# Patient Record
Sex: Female | Born: 1970 | Race: White | Hispanic: No | Smoking: Never smoker
Health system: Southern US, Community
[De-identification: ages and names within clinical notes are randomized; demographics above are authoritative.]

## PROBLEM LIST (undated history)

## (undated) DIAGNOSIS — E785 Hyperlipidemia, unspecified: Secondary | ICD-10-CM

## (undated) DIAGNOSIS — E119 Type 2 diabetes mellitus without complications: Secondary | ICD-10-CM

## (undated) DIAGNOSIS — K859 Acute pancreatitis without necrosis or infection, unspecified: Secondary | ICD-10-CM

## (undated) DIAGNOSIS — I10 Essential (primary) hypertension: Secondary | ICD-10-CM

## (undated) HISTORY — PX: HERNIA REPAIR: SHX51

## (undated) HISTORY — DX: Hyperlipidemia, unspecified: E78.5

---

## 2001-05-21 ENCOUNTER — Emergency Department (HOSPITAL_COMMUNITY): Admission: EM | Admit: 2001-05-21 | Discharge: 2001-05-21 | Payer: Self-pay | Admitting: Emergency Medicine

## 2008-08-24 ENCOUNTER — Ambulatory Visit (HOSPITAL_COMMUNITY): Admission: RE | Admit: 2008-08-24 | Discharge: 2008-08-24 | Payer: Self-pay | Admitting: Unknown Physician Specialty

## 2008-09-21 ENCOUNTER — Ambulatory Visit (HOSPITAL_COMMUNITY): Admission: RE | Admit: 2008-09-21 | Discharge: 2008-09-21 | Payer: Self-pay | Admitting: Unknown Physician Specialty

## 2008-10-18 ENCOUNTER — Ambulatory Visit (HOSPITAL_COMMUNITY): Admission: RE | Admit: 2008-10-18 | Discharge: 2008-10-18 | Payer: Self-pay | Admitting: Unknown Physician Specialty

## 2008-11-15 ENCOUNTER — Ambulatory Visit (HOSPITAL_COMMUNITY): Admission: RE | Admit: 2008-11-15 | Discharge: 2008-11-15 | Payer: Self-pay | Admitting: Unknown Physician Specialty

## 2008-12-07 ENCOUNTER — Ambulatory Visit (HOSPITAL_COMMUNITY): Admission: RE | Admit: 2008-12-07 | Discharge: 2008-12-07 | Payer: Self-pay | Admitting: Unknown Physician Specialty

## 2008-12-27 ENCOUNTER — Ambulatory Visit (HOSPITAL_COMMUNITY): Admission: RE | Admit: 2008-12-27 | Discharge: 2008-12-27 | Payer: Self-pay | Admitting: Unknown Physician Specialty

## 2009-01-19 ENCOUNTER — Ambulatory Visit (HOSPITAL_COMMUNITY): Admission: RE | Admit: 2009-01-19 | Discharge: 2009-01-19 | Payer: Self-pay | Admitting: Unknown Physician Specialty

## 2009-07-12 ENCOUNTER — Emergency Department (HOSPITAL_COMMUNITY): Admission: EM | Admit: 2009-07-12 | Discharge: 2009-07-12 | Payer: Self-pay | Admitting: Emergency Medicine

## 2009-07-19 ENCOUNTER — Emergency Department (HOSPITAL_COMMUNITY): Admission: EM | Admit: 2009-07-19 | Discharge: 2009-07-19 | Payer: Self-pay | Admitting: Emergency Medicine

## 2009-12-04 ENCOUNTER — Emergency Department (HOSPITAL_COMMUNITY): Admission: EM | Admit: 2009-12-04 | Discharge: 2009-12-04 | Payer: Self-pay | Admitting: Emergency Medicine

## 2009-12-29 ENCOUNTER — Ambulatory Visit (HOSPITAL_COMMUNITY): Admission: RE | Admit: 2009-12-29 | Discharge: 2009-12-29 | Payer: Self-pay | Admitting: General Surgery

## 2010-01-19 ENCOUNTER — Emergency Department (HOSPITAL_COMMUNITY): Admission: EM | Admit: 2010-01-19 | Discharge: 2010-01-20 | Payer: Self-pay | Admitting: Emergency Medicine

## 2010-03-14 ENCOUNTER — Emergency Department (HOSPITAL_COMMUNITY): Admission: EM | Admit: 2010-03-14 | Discharge: 2010-03-14 | Payer: Self-pay | Admitting: Emergency Medicine

## 2010-03-23 ENCOUNTER — Ambulatory Visit (HOSPITAL_COMMUNITY): Admission: RE | Admit: 2010-03-23 | Discharge: 2010-03-23 | Payer: Self-pay | Admitting: General Surgery

## 2010-09-27 LAB — GLUCOSE, CAPILLARY
Glucose-Capillary: 150 mg/dL — ABNORMAL HIGH (ref 70–99)
Glucose-Capillary: 158 mg/dL — ABNORMAL HIGH (ref 70–99)

## 2010-09-27 LAB — SURGICAL PCR SCREEN
MRSA, PCR: NEGATIVE
Staphylococcus aureus: NEGATIVE

## 2010-09-27 LAB — CBC
HCT: 38.7 % (ref 36.0–46.0)
Hemoglobin: 13.3 g/dL (ref 12.0–15.0)
MCH: 29.7 pg (ref 26.0–34.0)
MCHC: 34.5 g/dL (ref 30.0–36.0)
MCV: 86.1 fL (ref 78.0–100.0)
Platelets: 328 10*3/uL (ref 150–400)
RBC: 4.49 MIL/uL (ref 3.87–5.11)
RDW: 13.6 % (ref 11.5–15.5)
WBC: 10.2 10*3/uL (ref 4.0–10.5)

## 2010-09-27 LAB — HCG, QUANTITATIVE, PREGNANCY: hCG, Beta Chain, Quant, S: <2 m[IU]/mL (ref <5)

## 2010-09-27 LAB — BASIC METABOLIC PANEL
BUN: 9 mg/dL (ref 6–23)
CO2: 23 mEq/L (ref 19–32)
Calcium: 9.8 mg/dL (ref 8.4–10.5)
Chloride: 104 mEq/L (ref 96–112)
Creatinine, Ser: 0.58 mg/dL (ref 0.4–1.2)
GFR calc Af Amer: >60 mL/min (ref >60)
GFR calc non Af Amer: >60 mL/min (ref >60)
Glucose, Bld: 147 mg/dL — ABNORMAL HIGH (ref 70–99)
Potassium: 4.4 mEq/L (ref 3.5–5.1)
Sodium: 138 mEq/L (ref 135–145)

## 2010-09-28 LAB — CBC
HCT: 41.4 % (ref 36.0–46.0)
Hemoglobin: 14 g/dL (ref 12.0–15.0)
MCH: 29.3 pg (ref 26.0–34.0)
MCHC: 33.9 g/dL (ref 30.0–36.0)
MCV: 86.6 fL (ref 78.0–100.0)
Platelets: 371 10*3/uL (ref 150–400)
RBC: 4.78 MIL/uL (ref 3.87–5.11)
RDW: 14.1 % (ref 11.5–15.5)
WBC: 11.6 10*3/uL — ABNORMAL HIGH (ref 4.0–10.5)

## 2010-09-28 LAB — DIFFERENTIAL
Basophils Absolute: 0.1 10*3/uL (ref 0.0–0.1)
Basophils Relative: 1 % (ref 0–1)
Eosinophils Absolute: 0.4 10*3/uL (ref 0.0–0.7)
Eosinophils Relative: 3 % (ref 0–5)
Lymphocytes Relative: 31 % (ref 12–46)
Lymphs Abs: 3.6 10*3/uL (ref 0.7–4.0)
Monocytes Absolute: 0.8 10*3/uL (ref 0.1–1.0)
Monocytes Relative: 7 % (ref 3–12)
Neutro Abs: 6.7 10*3/uL (ref 1.7–7.7)
Neutrophils Relative %: 58 % (ref 43–77)

## 2010-09-28 LAB — COMPREHENSIVE METABOLIC PANEL
ALT: 16 U/L (ref 0–35)
AST: 18 U/L (ref 0–37)
Albumin: 3.7 g/dL (ref 3.5–5.2)
Alkaline Phosphatase: 68 U/L (ref 39–117)
BUN: 10 mg/dL (ref 6–23)
CO2: 20 mEq/L (ref 19–32)
Calcium: 9.6 mg/dL (ref 8.4–10.5)
Chloride: 106 mEq/L (ref 96–112)
Creatinine, Ser: 0.63 mg/dL (ref 0.4–1.2)
GFR calc Af Amer: >60 mL/min (ref >60)
GFR calc non Af Amer: >60 mL/min (ref >60)
Glucose, Bld: 114 mg/dL — ABNORMAL HIGH (ref 70–99)
Potassium: 4 mEq/L (ref 3.5–5.1)
Sodium: 137 mEq/L (ref 135–145)
Total Bilirubin: 0.4 mg/dL (ref 0.3–1.2)
Total Protein: 7 g/dL (ref 6.0–8.3)

## 2010-09-28 LAB — PREGNANCY, URINE: Preg Test, Ur: NEGATIVE

## 2010-09-28 LAB — URINALYSIS, ROUTINE W REFLEX MICROSCOPIC
Bilirubin Urine: NEGATIVE
Glucose, UA: NEGATIVE mg/dL
Hgb urine dipstick: NEGATIVE
Ketones, ur: NEGATIVE mg/dL
Nitrite: NEGATIVE
Protein, ur: NEGATIVE mg/dL
Specific Gravity, Urine: >1.03 — ABNORMAL HIGH (ref 1.005–1.030)
Urobilinogen, UA: 0.2 mg/dL (ref 0.0–1.0)
pH: 5.5 (ref 5.0–8.0)

## 2010-09-28 LAB — LIPASE, BLOOD: Lipase: 32 U/L (ref 11–59)

## 2010-09-30 LAB — GLUCOSE, CAPILLARY
Glucose-Capillary: 127 mg/dL — ABNORMAL HIGH (ref 70–99)
Glucose-Capillary: 191 mg/dL — ABNORMAL HIGH (ref 70–99)

## 2010-10-01 LAB — HCG, QUANTITATIVE, PREGNANCY: hCG, Beta Chain, Quant, S: <2 m[IU]/mL (ref <5)

## 2010-10-01 LAB — CBC
HCT: 38.9 % (ref 36.0–46.0)
Hemoglobin: 13.5 g/dL (ref 12.0–15.0)
MCHC: 34.7 g/dL (ref 30.0–36.0)
MCV: 86 fL (ref 78.0–100.0)
Platelets: 287 10*3/uL (ref 150–400)
RBC: 4.52 MIL/uL (ref 3.87–5.11)
RDW: 13.2 % (ref 11.5–15.5)
WBC: 10.1 10*3/uL (ref 4.0–10.5)

## 2010-10-01 LAB — BASIC METABOLIC PANEL
BUN: 5 mg/dL — ABNORMAL LOW (ref 6–23)
CO2: 20 mEq/L (ref 19–32)
Calcium: 9.8 mg/dL (ref 8.4–10.5)
Chloride: 105 mEq/L (ref 96–112)
Creatinine, Ser: 0.57 mg/dL (ref 0.4–1.2)
GFR calc Af Amer: >60 mL/min (ref >60)
GFR calc non Af Amer: >60 mL/min (ref >60)
Glucose, Bld: 186 mg/dL — ABNORMAL HIGH (ref 70–99)
Potassium: 3.9 mEq/L (ref 3.5–5.1)
Sodium: 133 mEq/L — ABNORMAL LOW (ref 135–145)

## 2010-10-01 LAB — SURGICAL PCR SCREEN
MRSA, PCR: NEGATIVE
Staphylococcus aureus: NEGATIVE

## 2015-09-25 ENCOUNTER — Emergency Department (HOSPITAL_COMMUNITY): Payer: Worker's Compensation

## 2015-09-25 ENCOUNTER — Emergency Department (HOSPITAL_COMMUNITY)
Admission: EM | Admit: 2015-09-25 | Discharge: 2015-09-25 | Disposition: A | Discharge status: Home / Self Care | Payer: Worker's Compensation | Attending: Emergency Medicine | Admitting: Emergency Medicine

## 2015-09-25 ENCOUNTER — Encounter (HOSPITAL_COMMUNITY): Payer: Self-pay | Admitting: *Deleted

## 2015-09-25 DIAGNOSIS — Y9289 Other specified places as the place of occurrence of the external cause: Secondary | ICD-10-CM | POA: Insufficient documentation

## 2015-09-25 DIAGNOSIS — Y990 Civilian activity done for income or pay: Secondary | ICD-10-CM | POA: Insufficient documentation

## 2015-09-25 DIAGNOSIS — I10 Essential (primary) hypertension: Secondary | ICD-10-CM | POA: Insufficient documentation

## 2015-09-25 DIAGNOSIS — S0093XA Contusion of unspecified part of head, initial encounter: Secondary | ICD-10-CM

## 2015-09-25 DIAGNOSIS — Y9389 Activity, other specified: Secondary | ICD-10-CM | POA: Insufficient documentation

## 2015-09-25 DIAGNOSIS — W208XXA Other cause of strike by thrown, projected or falling object, initial encounter: Secondary | ICD-10-CM | POA: Diagnosis not present

## 2015-09-25 DIAGNOSIS — S82242A Displaced spiral fracture of shaft of left tibia, initial encounter for closed fracture: Secondary | ICD-10-CM | POA: Insufficient documentation

## 2015-09-25 DIAGNOSIS — F419 Anxiety disorder, unspecified: Secondary | ICD-10-CM | POA: Diagnosis not present

## 2015-09-25 DIAGNOSIS — S82202A Unspecified fracture of shaft of left tibia, initial encounter for closed fracture: Secondary | ICD-10-CM

## 2015-09-25 DIAGNOSIS — S066X1A Traumatic subarachnoid hemorrhage with loss of consciousness of 30 minutes or less, initial encounter: Secondary | ICD-10-CM | POA: Diagnosis not present

## 2015-09-25 DIAGNOSIS — S99912A Unspecified injury of left ankle, initial encounter: Secondary | ICD-10-CM | POA: Diagnosis present

## 2015-09-25 DIAGNOSIS — E119 Type 2 diabetes mellitus without complications: Secondary | ICD-10-CM | POA: Diagnosis not present

## 2015-09-25 HISTORY — DX: Type 2 diabetes mellitus without complications: E11.9

## 2015-09-25 HISTORY — DX: Essential (primary) hypertension: I10

## 2015-09-25 LAB — CBG MONITORING, ED: Glucose-Capillary: 175 mg/dL — ABNORMAL HIGH (ref 65–99)

## 2015-09-25 MED ORDER — ONDANSETRON HCL 4 MG/2ML IJ SOLN
4.0000 mg | Freq: Once | INTRAMUSCULAR | Status: AC
  Administered 2015-09-25: 4 mg via INTRAVENOUS
  Filled 2015-09-25: qty 2

## 2015-09-25 MED ORDER — HYDROMORPHONE HCL 1 MG/ML IJ SOLN
1.0000 mg | Freq: Once | INTRAMUSCULAR | Status: AC
  Administered 2015-09-25: 1 mg via INTRAVENOUS
  Filled 2015-09-25: qty 1

## 2015-09-25 MED ORDER — OXYCODONE-ACETAMINOPHEN 5-325 MG PO TABS
1.0000 | ORAL_TABLET | Freq: Once | ORAL | Status: AC
  Administered 2015-09-25: 1 via ORAL
  Filled 2015-09-25: qty 1

## 2015-09-25 MED ORDER — PROMETHAZINE HCL 25 MG/ML IJ SOLN
25.0000 mg | Freq: Once | INTRAMUSCULAR | Status: AC
Start: 2015-09-25 — End: 2015-09-25
  Administered 2015-09-25: 25 mg via INTRAVENOUS
  Filled 2015-09-25: qty 1

## 2015-09-25 MED ORDER — FENTANYL CITRATE (PF) 100 MCG/2ML IJ SOLN
100.0000 ug | Freq: Once | INTRAMUSCULAR | Status: AC
  Administered 2015-09-25: 100 ug via INTRAVENOUS
  Filled 2015-09-25: qty 2

## 2015-09-25 MED ORDER — FENTANYL CITRATE (PF) 100 MCG/2ML IJ SOLN
50.0000 ug | Freq: Once | INTRAMUSCULAR | Status: AC
  Administered 2015-09-25: 50 ug via INTRAVENOUS
  Filled 2015-09-25: qty 2

## 2015-09-25 MED ORDER — ONDANSETRON 4 MG PO TBDP: 4.0000 mg | ORAL_TABLET | Freq: Three times a day (TID) | ORAL | Status: DC | PRN

## 2015-09-25 MED ORDER — OXYCODONE-ACETAMINOPHEN 5-325 MG PO TABS: 1.0000 | ORAL_TABLET | ORAL | Status: DC | PRN

## 2015-09-25 MED ORDER — SODIUM CHLORIDE 0.9 % IV BOLUS (SEPSIS)
500.0000 mL | Freq: Once | INTRAVENOUS | Status: AC
  Administered 2015-09-25: 500 mL via INTRAVENOUS

## 2015-09-25 NOTE — Progress Notes (Signed)
Orthopedic Tech Progress Note Patient Details:  Kara Hopkins 12/21/1970 [id number]  Ortho Devices Type of Ortho Device: Ace wrap, Post (short leg) splint, Stirrup splint, Crutches Ortho Device/Splint Location: LLE Ortho Device/Splint Interventions: Ordered, Application   Jennye MoccasinHughes, Maygen Sirico Craig 09/25/2015, 5:25 PM

## 2015-09-25 NOTE — ED Notes (Signed)
Ortho notified of need of splint.

## 2015-09-25 NOTE — ED Provider Notes (Signed)
CSN: [id number]     Arrival date & time 09/25/15  1202 History   First MD Initiated Contact with Patient 09/25/15 1216     Chief Complaint  Patient presents with  . Fall    HPI   Ms. Kara Hopkins is an 45 y.o. female with history of DM and HTN who presents to the ED for evaluation after injury at [employer] where she works. Per EMS, pt was working at [employer] when a pallet of peat moss fell on her and she was knocked out. In the ED pt is amnestic to the event. She states she remembers being at work and somebody talking about moving the peat moss, and the next thing she remembers is being in the ambulance. She states her head has a mild diffuse headache and she has a "knot" on the back of her head. She states she has 10/10 excruciating pain from the middle of her left shin down to her left ankle. She is tearful and anxious. She denies any dizziness, visual disturbance, neck pain, back pain. Denies n/v. Denies abdominal pain. Denies any pain or weakness with her upper extremities. Denies chest pain or SOB. She is not on any blood thinners.   Past Medical History  Diagnosis Date  . Diabetes mellitus without complication (HCC)   . Hypertension    Past Surgical History  Procedure Laterality Date  . Hernia repair    . Cesarean section     No family history on file. Social History  Substance Use Topics  . Smoking status: Never Smoker   . Smokeless tobacco: None  . Alcohol Use: No   OB History    No data available     Review of Systems  All other systems reviewed and are negative.     Allergies  Codeine and Sulfa antibiotics  Home Medications   Prior to Admission medications   Not on File   BP 128/72 mmHg  Temp(Src) 98.9 F (37.2 C) (Oral)  Resp 18  Ht  (1.676 m)  Wt 106.595 kg  BMI 37.95 kg/m2  SpO2 100%  LMP 09/25/2015 Physical Exam  Constitutional: She is oriented to person, place, and time.  Tearful, anxious.   HENT:  Head:    Right Ear: Tympanic membrane and external  ear normal.  Left Ear: Tympanic membrane and external ear normal.  Nose: Nose normal.  Mouth/Throat: Oropharynx is clear and moist. No oropharyngeal exudate.  No facial abrasions or lacerations. No racoons eyes or battle signs. No facial tenderness or crepitus.   Eyes: Conjunctivae and EOM are normal. Pupils are equal, round, and reactive to [place].  EOM intact, no nystagmus  Neck: Normal range of motion. Neck supple.  No c-spine tenderness. FROM of neck.   Cardiovascular: Normal rate, regular rhythm, normal heart sounds and intact distal pulses.   Pulmonary/Chest: Effort normal and breath sounds normal. No respiratory distress. She has no wheezes. She exhibits no tenderness.  No clavicular or anterior chest tenderness. Breath sounds normal. No increased WOB or tachypnea.   Abdominal: Soft. Bowel sounds are normal. She exhibits no distension. There is no tenderness. There is no rebound and no guarding.  Musculoskeletal:  No c-spine, t-spine, or l-spine tenderness.  No hip tenderness. No knee tenderness.   Left mid shin markedly tender. Tender down to ankle. Left ankle diffusely TTP. Foot nontender. 2+ distal pulses. Limited knee and ankle ROM due to pain in leg.  Right LE unremarkable.   Neurological: She is alert and oriented to  person, place, and time. No cranial nerve deficit.  Normal finger to nose, no pronator drift  Skin: Skin is warm and dry.  Psychiatric: She has a normal mood and affect.  Nursing note and vitals reviewed.   ED Course  Procedures (including critical care time) Labs Review Labs Reviewed  CBG MONITORING, ED    Imaging Review Dg Tibia/fibula Left  09/25/2015  CLINICAL DATA:  Heavy object fell on leg today EXAM: LEFT TIBIA AND FIBULA - 2 VIEW COMPARISON:  None. FINDINGS: Oblique fracture distal diaphysis of the tibia. Distal fragment displaced about cortex width laterally. No other fractures. IMPRESSION: Fracture distal tibia Electronically Signed   By:  Esperanza Heir M.D.   On: 09/25/2015 13:52   Dg Ankle Complete Left  09/25/2015  CLINICAL DATA:  Left lower leg pain after something heavy fell on her leg this morning. EXAM: LEFT ANKLE COMPLETE - 3+ VIEW COMPARISON:  Tibia and fibula radiographs dated 09/25/2015 FINDINGS: There is a slightly displaced spiral fracture distal tibia centered approximately 8 cm above the ankle joint. Distal fibula is intact. Slight degenerative arthritic changes of the ankle joint. Plantar calcaneal spur. IMPRESSION: Slightly displaced spiral fracture of the distal left tibial shaft Electronically Signed   By: Francene Boyers M.D.   On: 09/25/2015 13:54   Ct Head Wo Contrast  09/25/2015  CLINICAL DATA:  Patient hit by a falling Pallet with positive loss of consciousness for approximately 1 minute. Posterior head pain and neck stiffness. EXAM: CT HEAD WITHOUT CONTRAST CT CERVICAL SPINE WITHOUT CONTRAST TECHNIQUE: Multidetector CT imaging of the head and cervical spine was performed following the standard protocol without intravenous contrast. Multiplanar CT image reconstructions of the cervical spine were also generated. COMPARISON:  05/13/2011 FINDINGS: CT HEAD FINDINGS Ventricles, cisterns and other CSF spaces are within normal. There is a 5 mm hyperdense focus over the high left parafalcine region which may represent a small focus of acute subarachnoid hemorrhage. Possible tiny focus of acute subarachnoid hemorrhage adjacent the left sylvian fissure. Likely minimal early calcification over the left basal ganglia. No evidence of mass, mass effect, shift of midline structures or acute infarction. Moderate right posterior parietal scalp contusion. No evidence of fracture. Mild chronic inflammatory change over the frontal and sphenoid sinuses. CT CERVICAL SPINE FINDINGS There is mild straightening of the normal cervical lordosis. There is mild spondylosis of the cervical spine. There is no evidence of subluxation or fracture.  Prevertebral soft tissues are within normal. The atlantoaxial articulation is normal. Mild uncovertebral joint spurring is present. Mild prominence of posterior osteophytes over the mid to lower cervical spine. IMPRESSION: 5 mm hyperdense focus over the high left parafalcine region likely a small focus of acute subarachnoid hemorrhage. Possible tiny amount of acute subarachnoid hemorrhage in the left perisylvian region. Moderate-size posterior right parietal scalp contusion. No fracture. Mild spondylosis of the cervical spine. No acute cervical spine injury. Critical Value/emergent results were called by telephone at the time of interpretation on 09/25/2015 at 2:58 pm to Dr. Alice Reichert, who verbally acknowledged these results. Electronically Signed   By: Elberta Fortis M.D.   On: 09/25/2015 14:59   Ct Cervical Spine Wo Contrast  09/25/2015  CLINICAL DATA:  Patient hit by a falling Pallet with positive loss of consciousness for approximately 1 minute. Posterior head pain and neck stiffness. EXAM: CT HEAD WITHOUT CONTRAST CT CERVICAL SPINE WITHOUT CONTRAST TECHNIQUE: Multidetector CT imaging of the head and cervical spine was performed following the standard protocol without intravenous contrast.  Multiplanar CT image reconstructions of the cervical spine were also generated. COMPARISON:  05/13/2011 FINDINGS: CT HEAD FINDINGS Ventricles, cisterns and other CSF spaces are within normal. There is a 5 mm hyperdense focus over the high left parafalcine region which may represent a small focus of acute subarachnoid hemorrhage. Possible tiny focus of acute subarachnoid hemorrhage adjacent the left sylvian fissure. Likely minimal early calcification over the left basal ganglia. No evidence of mass, mass effect, shift of midline structures or acute infarction. Moderate right posterior parietal scalp contusion. No evidence of fracture. Mild chronic inflammatory change over the frontal and sphenoid sinuses. CT CERVICAL  SPINE FINDINGS There is mild straightening of the normal cervical lordosis. There is mild spondylosis of the cervical spine. There is no evidence of subluxation or fracture. Prevertebral soft tissues are within normal. The atlantoaxial articulation is normal. Mild uncovertebral joint spurring is present. Mild prominence of posterior osteophytes over the mid to lower cervical spine. IMPRESSION: 5 mm hyperdense focus over the high left parafalcine region likely a small focus of acute subarachnoid hemorrhage. Possible tiny amount of acute subarachnoid hemorrhage in the left perisylvian region. Moderate-size posterior right parietal scalp contusion. No fracture. Mild spondylosis of the cervical spine. No acute cervical spine injury. Critical Value/emergent results were called by telephone at the time of interpretation on 09/25/2015 at 2:58 pm to Dr. Alice ReichertSam Jacobawitz, who verbally acknowledged these results. Electronically Signed   By: Elberta Fortisaniel  Boyle M.D.   On: 09/25/2015 14:59   I have personally reviewed and evaluated these images and lab results as part of my medical decision-making.   EKG Interpretation None      MDM   Final diagnoses:  Left tibial fracture, closed, initial encounter  Head contusion, initial encounter  Traumatic subarachnoid hematoma with loss of consciousness, 30 minutes or less, initial encounter (HCC)    X-ray reveals left closed, oblique mildly displaced tibial fracture. CT head and c-spine still pending. Ortho consulted. Dr. Eulah PontMurphy to come see pt in the ED. For now pain control, elevate leg, and NPO.   CT head shows small 5mm SAH over left parafalcine region. Possible tiny SAH in left perisylvian region as well. Neurosurgery consulted.  Spoke to Dr. Eulah PontMurphy. If neurosurgery recommends admission will plan for surgery here. If pt being sent home, pain control, splint/crutches, and see first thing Wednesday 3/15 AM. Plan for OR Thursday or Friday.   I spoke to Dr. Jordan LikesPool of  neurosurgery who reccomends that pt can go home since she is neurologically intact. Discussed strict return precautions with pt and her family. Instructed avoidance of ASA.   Pt placed in short leg splint per Dr. Greig RightMurphy's recs. She is able to ambulate without feeling dizzy or lightheaded. Her pain is controlled. She has remained tachycardic which I suspect is secondary to pain/anxiety. She denies any chest pain or SOB, has no hypoxia or tachypnea. She is stable for discharge with outpatient f/u. Rx given for pain meds. Instructed to f/u with ortho Wednesday as above. Strict ER return precautions given. Pt verbalized her agreementa nd understanding.   Carlene CoriaSerena Y Harly Pipkins, PA-C 09/25/15 1842  Doug SouSam Jacubowitz, MD 09/27/15 [id number]

## 2015-09-25 NOTE — ED Notes (Signed)
Returned from [facility] and [facility]

## 2015-09-25 NOTE — ED Notes (Signed)
To ct and xray

## 2015-09-25 NOTE — ED Notes (Signed)
Pt was working at [employer] where she had a pallet full of peat moss dumped over onto pt knocking here down.  Pt reportedly had positive LOC, C/O left ankle pain and right knee pain with abrasions to both.

## 2015-09-25 NOTE — Discharge Instructions (Signed)
You were seen in the ER today for evaluation after an accident at work. Your x-ray of your leg shows you have a fracture of your tibia. I spoke with Dr. Renaye Rakers of orthopedics who will see you in his office first thing Wednesday morning. Please call his office tomorrow to let them you know you will be coming. Dr. Eulah Pont said to plan on going to his office Wednesday morning regardless if you are able to speak with someone tomorrow. In the meantime I will give you a prescription for pain medicine and nausea medicine. If your pain is not that strong you may take tylenol instead.  Your CT scan showed a small spot of bleeding. I spoke with the neurosurgeon and he reviewed the CT scan as well. The area is very small and should clear up on its own. You might have concussion-like symptoms for the next couple of days (some headache, dizziness). However, return to the ER immediately for new or worsening symptoms. Avoid aspirin.      Tibial Fracture, Adult A tibial fracture is a break in the larger bone of your lower leg (tibia). This bone is also called the shin bone. CAUSES   Low-energy injuries, such as a fall from ground level.   High-energy injuries, such as motor vehicle injuries or high-speed sports collisions. RISK FACTORS  Jumping activities.   Repetitive stress, such as long-distance running.   Participation in sports.   Osteoporosis.   Advanced age.  SIGNS AND SYMPTOMS  Pain.   Swelling.   Inability to put weight on your injured leg.   Bone deformities at the site of your injury.   Bruising.  DIAGNOSIS  A tibial fracture can usually be diagnosed using X-rays. TREATMENT  A tibial fracture will often be treated with simple immobilization. A cast or splint will be used on your leg to keep it from moving while it heals. If the injury caused parts of the bone to move out of place, your health care provider may reposition those parts before putting on your cast or  splint. The cast or splint will remain in place until your health care provider thinks the bone has healed well enough. Then you can begin range-of-motion exercises to regain your knee motion. For severe injuries, surgery is sometimes needed to insert plates or screws into the injured area. HOME CARE INSTRUCTIONS   If you have a plaster or fiberglass cast:   Do not try to scratch the skin under the cast using sharp or pointed objects.   Check the skin around the cast every day. You may put lotion on any red or sore areas.   Keep your cast dry and clean.   If you have a plaster splint:   Wear the splint as directed.   Loosen the elastic around the splint if your toes become numb, tingle, or turn cold or blue.   Do not put pressure on any part of your cast or splint until it is fully hardened.   Use a plastic bag to protect your cast or splint during bathing. Do not lower the cast or splint into water.   Use crutches as directed.   Take medicines only as directed by your health care provider.   Keep all follow-up visits as directed by your health care provider. This is important.  SEEK MEDICAL CARE IF:  Your pain is becoming worse rather than better or is not controlled with medicines.   You have increased swelling or redness in your  foot.   You begin to lose feeling in your foot or toes.  SEEK IMMEDIATE MEDICAL CARE IF:   Your foot or toes on the injured side feel cold or turn blue.   You develop severe pain in your injured leg, especially if the pain is increased with movement of your toes.  MAKE SURE YOU:  Understand these instructions.   Will watch your condition.   Will get help right away if you are not doing well or get worse.    This information is not intended to replace advice given to you by your health care provider. Make sure you discuss any questions you have with your health care provider.   Document Released: 03/26/2001 Document  Revised: 11/15/2014 Document Reviewed: 08/25/2013 Elsevier Interactive Patient Education [employer]2016 Elsevier Inc.

## 2015-09-25 NOTE — ED Notes (Signed)
Pt used bedpan 

## 2015-09-25 NOTE — ED Provider Notes (Signed)
Complains left leg pain and bilateral rib pain after a pallet fell on her today, knocking her down.. She denies abdominal pain. Had questionable loss of conscious after event. On exam alert Glasgow Coma Score 15 . HEENT exam no facial asymmetry neck nontender chest without crepitance or flail or point tenderness abdomen obese, nontender pelvis stable nontender. Left lower extremity skin intact. DP pulse 2+. All other extremities without contusion abrasion or tenderness neurovascular intact X-rays viewed by me  Doug SouSam Kache Mcclurg, MD 09/25/15 1755

## 2015-09-27 ENCOUNTER — Encounter (HOSPITAL_BASED_OUTPATIENT_CLINIC_OR_DEPARTMENT_OTHER): Payer: Self-pay | Admitting: *Deleted

## 2015-09-28 ENCOUNTER — Other Ambulatory Visit: Payer: Self-pay | Admitting: Physician Assistant

## 2015-09-28 NOTE — H&P (Signed)
Kara Hopkins is a [age] y/o female who presents to our clinic 2 days following an incident that occurred while working at [employer].  She sustained a left tibial shaft fracture after a pallet of peat moss fell and knocked her out.  [facility] where x-rays were obtained which demonstrated a spiral fracture to the distal aspect of her tibial shaft on the left.  She was placed in a splint and told to fu with ortho.  She presented to our clinic with pain and decreased ROM to the left lower extremity.  She has pain with weight bearing as well as ADLs.    Past medical, family and social history reviewed in patient chart.    Review of systems as detailed in hpi, all others are negative.   Physical Exam: lungs clear to auscultation bilaterally.  Heart sounds normal.  left mid-shin and foot markedly tender to palpation.  2+ distal pulses.  Decreased ROM left knee and ankle.  X-rays: Spiral fracture to the distal aspect of the tibial shaft on the left  Impression: Spiral fracture to the distal aspect of the tibial shaft on the left  Plan:  At this point there is nothing short of operative intervention that will relieve her symptoms.  We will proceed with ORIF left tibia.  Patient agrees.  Risks, benefits and possible complications reviewed.  Paperwork completed.  We will see Kara Hopkins at time of operative intervention.

## 2015-09-29 ENCOUNTER — Ambulatory Visit (HOSPITAL_BASED_OUTPATIENT_CLINIC_OR_DEPARTMENT_OTHER)
Admission: RE | Admit: 2015-09-29 | Discharge: 2015-09-30 | Disposition: A | Discharge status: Home / Self Care | Payer: Worker's Compensation | Source: Ambulatory Visit | Attending: Orthopedic Surgery | Admitting: Orthopedic Surgery

## 2015-09-29 ENCOUNTER — Ambulatory Visit (HOSPITAL_BASED_OUTPATIENT_CLINIC_OR_DEPARTMENT_OTHER): Payer: Worker's Compensation | Admitting: Certified Registered"

## 2015-09-29 ENCOUNTER — Encounter (HOSPITAL_BASED_OUTPATIENT_CLINIC_OR_DEPARTMENT_OTHER)
Admission: RE | Disposition: A | Discharge status: Home / Self Care | Payer: Self-pay | Source: Ambulatory Visit | Attending: Orthopedic Surgery

## 2015-09-29 ENCOUNTER — Encounter (HOSPITAL_BASED_OUTPATIENT_CLINIC_OR_DEPARTMENT_OTHER): Payer: Self-pay | Admitting: Certified Registered"

## 2015-09-29 ENCOUNTER — Ambulatory Visit (HOSPITAL_COMMUNITY): Payer: Worker's Compensation

## 2015-09-29 DIAGNOSIS — K219 Gastro-esophageal reflux disease without esophagitis: Secondary | ICD-10-CM | POA: Diagnosis not present

## 2015-09-29 DIAGNOSIS — Z6836 Body mass index (BMI) 36.0-36.9, adult: Secondary | ICD-10-CM | POA: Diagnosis not present

## 2015-09-29 DIAGNOSIS — Y990 Civilian activity done for income or pay: Secondary | ICD-10-CM | POA: Diagnosis not present

## 2015-09-29 DIAGNOSIS — I10 Essential (primary) hypertension: Secondary | ICD-10-CM | POA: Insufficient documentation

## 2015-09-29 DIAGNOSIS — Y92512 Supermarket, store or market as the place of occurrence of the external cause: Secondary | ICD-10-CM | POA: Diagnosis not present

## 2015-09-29 DIAGNOSIS — W208XXA Other cause of strike by thrown, projected or falling object, initial encounter: Secondary | ICD-10-CM | POA: Insufficient documentation

## 2015-09-29 DIAGNOSIS — T148XXA Other injury of unspecified body region, initial encounter: Secondary | ICD-10-CM

## 2015-09-29 DIAGNOSIS — S89102A Unspecified physeal fracture of lower end of left tibia, initial encounter for closed fracture: Secondary | ICD-10-CM | POA: Diagnosis not present

## 2015-09-29 DIAGNOSIS — Z794 Long term (current) use of insulin: Secondary | ICD-10-CM | POA: Insufficient documentation

## 2015-09-29 DIAGNOSIS — Z79899 Other long term (current) drug therapy: Secondary | ICD-10-CM | POA: Insufficient documentation

## 2015-09-29 DIAGNOSIS — E109 Type 1 diabetes mellitus without complications: Secondary | ICD-10-CM | POA: Insufficient documentation

## 2015-09-29 DIAGNOSIS — S82209A Unspecified fracture of shaft of unspecified tibia, initial encounter for closed fracture: Secondary | ICD-10-CM | POA: Diagnosis present

## 2015-09-29 HISTORY — PX: TIBIA IM NAIL INSERTION: SHX2516

## 2015-09-29 LAB — POCT I-STAT, CHEM 8
BUN: 8 mg/dL (ref 6–20)
Calcium, Ion: 1.2 mmol/L (ref 1.12–1.23)
Chloride: 106 mmol/L (ref 101–111)
Creatinine, Ser: 0.5 mg/dL (ref 0.44–1.00)
Glucose, Bld: 243 mg/dL — ABNORMAL HIGH (ref 65–99)
HCT: 43 % (ref 36.0–46.0)
Hemoglobin: 14.6 g/dL (ref 12.0–15.0)
Potassium: 3.6 mmol/L (ref 3.5–5.1)
Sodium: 138 mmol/L (ref 135–145)
TCO2: 16 mmol/L (ref 0–100)

## 2015-09-29 LAB — GLUCOSE, CAPILLARY
Glucose-Capillary: 249 mg/dL — ABNORMAL HIGH (ref 65–99)
Glucose-Capillary: 156 mg/dL — ABNORMAL HIGH (ref 65–99)
Glucose-Capillary: 110 mg/dL — ABNORMAL HIGH (ref 65–99)

## 2015-09-29 SURGERY — INSERTION, INTRAMEDULLARY ROD, TIBIA
Anesthesia: General | Site: Leg Lower | Laterality: Left

## 2015-09-29 MED ORDER — LACTATED RINGERS IV SOLN: INTRAVENOUS | Status: DC

## 2015-09-29 MED ORDER — INSULIN DETEMIR 100 UNIT/ML ~~LOC~~ SOLN: 80.0000 [IU] | Freq: Every day | SUBCUTANEOUS | Status: DC

## 2015-09-29 MED ORDER — FENTANYL CITRATE (PF) 100 MCG/2ML IJ SOLN
INTRAMUSCULAR | Status: AC
  Filled 2015-09-29: qty 2

## 2015-09-29 MED ORDER — GLYCOPYRROLATE 0.2 MG/ML IJ SOLN: 0.2000 mg | Freq: Once | INTRAMUSCULAR | Status: DC | PRN

## 2015-09-29 MED ORDER — PROPOFOL 500 MG/50ML IV EMUL
INTRAVENOUS | Status: AC
  Filled 2015-09-29: qty 50

## 2015-09-29 MED ORDER — MORPHINE SULFATE (PF) 2 MG/ML IV SOLN
2.0000 mg | INTRAVENOUS | Status: DC | PRN
  Administered 2015-09-29 – 2015-09-30 (×5): 2 mg via INTRAVENOUS
  Filled 2015-09-29 (×5): qty 1

## 2015-09-29 MED ORDER — LIDOCAINE HCL (PF) 1 % IJ SOLN
INTRAMUSCULAR | Status: AC
  Filled 2015-09-29: qty 30

## 2015-09-29 MED ORDER — ASPIRIN EC 325 MG PO TBEC: 325.0000 mg | DELAYED_RELEASE_TABLET | Freq: Every day | ORAL | Status: DC

## 2015-09-29 MED ORDER — HYDROMORPHONE HCL 1 MG/ML IJ SOLN
INTRAMUSCULAR | Status: AC
  Filled 2015-09-29: qty 1

## 2015-09-29 MED ORDER — ACETAMINOPHEN 325 MG PO TABS
650.0000 mg | ORAL_TABLET | Freq: Four times a day (QID) | ORAL | Status: DC | PRN
  Filled 2015-09-29: qty 2

## 2015-09-29 MED ORDER — INSULIN ASPART 100 UNIT/ML ~~LOC~~ SOLN
2.0000 [IU] | Freq: Once | SUBCUTANEOUS | Status: AC
  Administered 2015-09-29: 2 [IU] via SUBCUTANEOUS

## 2015-09-29 MED ORDER — ONDANSETRON 4 MG PO TBDP: 4.0000 mg | ORAL_TABLET | Freq: Three times a day (TID) | ORAL | Status: DC | PRN

## 2015-09-29 MED ORDER — LABETALOL HCL 5 MG/ML IV SOLN
INTRAVENOUS | Status: DC | PRN
  Administered 2015-09-29 (×2): 5 mg via INTRAVENOUS

## 2015-09-29 MED ORDER — ONDANSETRON HCL 4 MG/2ML IJ SOLN
INTRAMUSCULAR | Status: AC
  Filled 2015-09-29: qty 2

## 2015-09-29 MED ORDER — SCOPOLAMINE 1 MG/3DAYS TD PT72: 1.0000 | MEDICATED_PATCH | Freq: Once | TRANSDERMAL | Status: DC | PRN

## 2015-09-29 MED ORDER — BUPIVACAINE-EPINEPHRINE (PF) 0.5% -1:200000 IJ SOLN
INTRAMUSCULAR | Status: AC
  Filled 2015-09-29: qty 30

## 2015-09-29 MED ORDER — INSULIN ASPART 100 UNIT/ML ~~LOC~~ SOLN
0.0000 [IU] | Freq: Three times a day (TID) | SUBCUTANEOUS | Status: DC
  Administered 2015-09-29: 3 [IU] via SUBCUTANEOUS
  Filled 2015-09-29: qty 1

## 2015-09-29 MED ORDER — DOCUSATE SODIUM 100 MG PO CAPS: 100.0000 mg | ORAL_CAPSULE | Freq: Two times a day (BID) | ORAL | Status: DC

## 2015-09-29 MED ORDER — METOCLOPRAMIDE HCL 5 MG/ML IJ SOLN
5.0000 mg | Freq: Three times a day (TID) | INTRAMUSCULAR | Status: DC | PRN
Start: 2015-09-29 — End: 2015-09-30
  Administered 2015-09-29 (×2): 5 mg via INTRAVENOUS
  Filled 2015-09-29: qty 2

## 2015-09-29 MED ORDER — PROPOFOL 10 MG/ML IV BOLUS
INTRAVENOUS | Status: DC | PRN
  Administered 2015-09-29: 200 mg via INTRAVENOUS

## 2015-09-29 MED ORDER — MIDAZOLAM HCL 2 MG/2ML IJ SOLN
INTRAMUSCULAR | Status: AC
  Filled 2015-09-29: qty 2

## 2015-09-29 MED ORDER — BUPIVACAINE-EPINEPHRINE (PF) 0.25% -1:200000 IJ SOLN
INTRAMUSCULAR | Status: AC
  Filled 2015-09-29: qty 30

## 2015-09-29 MED ORDER — METOCLOPRAMIDE HCL 5 MG/ML IJ SOLN
10.0000 mg | Freq: Once | INTRAMUSCULAR | Status: DC | PRN
  Filled 2015-09-29: qty 2

## 2015-09-29 MED ORDER — SODIUM CHLORIDE 0.9 % IV SOLN
INTRAVENOUS | Status: DC
  Administered 2015-09-29: 12:00:00 via INTRAVENOUS

## 2015-09-29 MED ORDER — FENTANYL CITRATE (PF) 100 MCG/2ML IJ SOLN
50.0000 ug | INTRAMUSCULAR | Status: AC | PRN
  Administered 2015-09-29: 50 ug via INTRAVENOUS
  Administered 2015-09-29: 25 ug via INTRAVENOUS
  Administered 2015-09-29: 50 ug via INTRAVENOUS
  Administered 2015-09-29: 25 ug via INTRAVENOUS
  Administered 2015-09-29: 50 ug via INTRAVENOUS

## 2015-09-29 MED ORDER — ACETAMINOPHEN 650 MG RE SUPP: 650.0000 mg | Freq: Four times a day (QID) | RECTAL | Status: DC | PRN

## 2015-09-29 MED ORDER — BUPIVACAINE HCL (PF) 0.25 % IJ SOLN
INTRAMUSCULAR | Status: DC | PRN
  Administered 2015-09-29: 10 mL

## 2015-09-29 MED ORDER — OXYCODONE HCL 5 MG PO TABS
5.0000 mg | ORAL_TABLET | ORAL | Status: DC | PRN
  Administered 2015-09-29 (×3): 10 mg via ORAL
  Filled 2015-09-29 (×4): qty 2

## 2015-09-29 MED ORDER — CEFAZOLIN SODIUM-DEXTROSE 2-3 GM-% IV SOLR
INTRAVENOUS | Status: DC | PRN
  Administered 2015-09-29: 2 g via INTRAVENOUS

## 2015-09-29 MED ORDER — CHLORHEXIDINE GLUCONATE 4 % EX LIQD: 60.0000 mL | Freq: Once | CUTANEOUS | Status: DC

## 2015-09-29 MED ORDER — PANTOPRAZOLE SODIUM 40 MG PO TBEC
40.0000 mg | DELAYED_RELEASE_TABLET | Freq: Every day | ORAL | Status: DC
  Administered 2015-09-29: 40 mg via ORAL

## 2015-09-29 MED ORDER — OXYCODONE-ACETAMINOPHEN 5-325 MG PO TABS: 1.0000 | ORAL_TABLET | ORAL | Status: DC | PRN

## 2015-09-29 MED ORDER — PREGABALIN 100 MG PO CAPS
300.0000 mg | ORAL_CAPSULE | Freq: Two times a day (BID) | ORAL | Status: DC
  Administered 2015-09-29 (×2): 300 mg via ORAL

## 2015-09-29 MED ORDER — CEFAZOLIN SODIUM-DEXTROSE 2-3 GM-% IV SOLR
INTRAVENOUS | Status: AC
  Filled 2015-09-29: qty 50

## 2015-09-29 MED ORDER — CEFAZOLIN SODIUM-DEXTROSE 2-3 GM-% IV SOLR
2.0000 g | Freq: Four times a day (QID) | INTRAVENOUS | Status: AC
  Administered 2015-09-29 (×2): 2 g via INTRAVENOUS
  Filled 2015-09-29 (×3): qty 50

## 2015-09-29 MED ORDER — LIDOCAINE HCL (CARDIAC) 20 MG/ML IV SOLN
INTRAVENOUS | Status: AC
  Filled 2015-09-29: qty 5

## 2015-09-29 MED ORDER — GLIPIZIDE 5 MG PO TABS: 5.0000 mg | ORAL_TABLET | Freq: Two times a day (BID) | ORAL | Status: DC

## 2015-09-29 MED ORDER — CANAGLIFLOZIN 300 MG PO TABS: 300.0000 mg | ORAL_TABLET | Freq: Every day | ORAL | Status: DC

## 2015-09-29 MED ORDER — HYDROMORPHONE HCL 1 MG/ML IJ SOLN
1.0000 mg | INTRAMUSCULAR | Status: DC | PRN
  Administered 2015-09-29 – 2015-09-30 (×2): 1 mg via INTRAVENOUS
  Filled 2015-09-29 (×2): qty 1

## 2015-09-29 MED ORDER — CLINDAMYCIN PHOSPHATE 900 MG/50ML IV SOLN
INTRAVENOUS | Status: AC
  Filled 2015-09-29: qty 50

## 2015-09-29 MED ORDER — MIDAZOLAM HCL 2 MG/2ML IJ SOLN
1.0000 mg | INTRAMUSCULAR | Status: DC | PRN
  Administered 2015-09-29: 2 mg via INTRAVENOUS

## 2015-09-29 MED ORDER — DEXAMETHASONE SODIUM PHOSPHATE 10 MG/ML IJ SOLN
INTRAMUSCULAR | Status: AC
  Filled 2015-09-29: qty 1

## 2015-09-29 MED ORDER — LIDOCAINE HCL (CARDIAC) 20 MG/ML IV SOLN
INTRAVENOUS | Status: DC | PRN
  Administered 2015-09-29: 60 mg via INTRAVENOUS

## 2015-09-29 MED ORDER — DEXAMETHASONE SODIUM PHOSPHATE 10 MG/ML IJ SOLN
INTRAMUSCULAR | Status: DC | PRN
  Administered 2015-09-29: 4 mg via INTRAVENOUS

## 2015-09-29 MED ORDER — DOCUSATE SODIUM 100 MG PO CAPS
100.0000 mg | ORAL_CAPSULE | Freq: Two times a day (BID) | ORAL | Status: DC
  Administered 2015-09-29: 100 mg via ORAL
  Filled 2015-09-29: qty 1

## 2015-09-29 MED ORDER — LACTATED RINGERS IV SOLN
INTRAVENOUS | Status: DC
  Administered 2015-09-29 (×2): via INTRAVENOUS
  Administered 2015-09-29: 10 mL/h via INTRAVENOUS

## 2015-09-29 MED ORDER — BUPIVACAINE HCL (PF) 0.25 % IJ SOLN
INTRAMUSCULAR | Status: AC
  Filled 2015-09-29: qty 90

## 2015-09-29 MED ORDER — BUPIVACAINE HCL (PF) 0.5 % IJ SOLN
INTRAMUSCULAR | Status: AC
  Filled 2015-09-29: qty 90

## 2015-09-29 MED ORDER — CLINDAMYCIN PHOSPHATE 900 MG/50ML IV SOLN
900.0000 mg | INTRAVENOUS | Status: AC
  Administered 2015-09-29: 900 mg via INTRAVENOUS

## 2015-09-29 MED ORDER — LISINOPRIL 10 MG PO TABS
10.0000 mg | ORAL_TABLET | Freq: Every day | ORAL | Status: DC
  Administered 2015-09-29: 10 mg via ORAL

## 2015-09-29 MED ORDER — HYDROMORPHONE HCL 1 MG/ML IJ SOLN
0.2500 mg | INTRAMUSCULAR | Status: DC | PRN
  Administered 2015-09-29 (×5): 0.5 mg via INTRAVENOUS

## 2015-09-29 MED ORDER — METOCLOPRAMIDE HCL 5 MG PO TABS: 5.0000 mg | ORAL_TABLET | Freq: Three times a day (TID) | ORAL | Status: DC | PRN

## 2015-09-29 MED ORDER — MEPERIDINE HCL 25 MG/ML IJ SOLN: 6.2500 mg | INTRAMUSCULAR | Status: DC | PRN

## 2015-09-29 MED ORDER — ONDANSETRON HCL 4 MG/2ML IJ SOLN
INTRAMUSCULAR | Status: DC | PRN
  Administered 2015-09-29: 4 mg via INTRAVENOUS

## 2015-09-29 SURGICAL SUPPLY — 87 items
BAG DECANTER FOR FLEXI CONT (MISCELLANEOUS) IMPLANT
BANDAGE ACE 6X5 VEL STRL LF (GAUZE/BANDAGES/DRESSINGS) ×4 IMPLANT
BANDAGE ESMARK 6X9 LF (GAUZE/BANDAGES/DRESSINGS) ×2 IMPLANT
BIT DRILL AO GAMMA 4.2X180 (BIT) ×4 IMPLANT
BIT DRILL AO GAMMA 4.2X340 (BIT) ×4 IMPLANT
BLADE SURG 15 STRL LF DISP TIS (BLADE) ×2 IMPLANT
BLADE SURG 15 STRL SS (BLADE) ×2
BNDG COHESIVE 4X5 TAN STRL (GAUZE/BANDAGES/DRESSINGS) ×4 IMPLANT
BNDG ESMARK 6X9 LF (GAUZE/BANDAGES/DRESSINGS) ×4
BOOT STEPPER DURA MED (SOFTGOODS) ×4 IMPLANT
CANISTER SUCT 1200ML W/VALVE (MISCELLANEOUS) ×4 IMPLANT
CHLORAPREP W/TINT 26ML (MISCELLANEOUS) ×4 IMPLANT
CLOSURE STERI-STRIP 1/2X4 (GAUZE/BANDAGES/DRESSINGS)
CLSR STERI-STRIP ANTIMIC 1/2X4 (GAUZE/BANDAGES/DRESSINGS) IMPLANT
COVER BACK TABLE 60X90IN (DRAPES) ×8 IMPLANT
COVER MAYO STAND STRL (DRAPES) ×4 IMPLANT
CUFF TOURNIQUET SINGLE 34IN LL (TOURNIQUET CUFF) ×4 IMPLANT
DRAPE C-ARM 42X72 X-RAY (DRAPES) ×4 IMPLANT
DRAPE C-ARMOR (DRAPES) ×4 IMPLANT
DRAPE EXTREMITY T 121X128X90 (DRAPE) ×4 IMPLANT
DRAPE IMP U-DRAPE 54X76 (DRAPES) ×4 IMPLANT
DRAPE U-SHAPE 47X51 STRL (DRAPES) ×4 IMPLANT
DRSG PAD ABDOMINAL 8X10 ST (GAUZE/BANDAGES/DRESSINGS) ×4 IMPLANT
ELECT REM PT RETURN 9FT ADLT (ELECTROSURGICAL) ×4
ELECTRODE REM PT RTRN 9FT ADLT (ELECTROSURGICAL) ×2 IMPLANT
GAUZE SPONGE 4X4 12PLY STRL (GAUZE/BANDAGES/DRESSINGS) ×4 IMPLANT
GAUZE XEROFORM 1X8 LF (GAUZE/BANDAGES/DRESSINGS) ×4 IMPLANT
GLOVE BIO SURGEON STRL SZ7 (GLOVE) IMPLANT
GLOVE BIO SURGEON STRL SZ7.5 (GLOVE) ×8 IMPLANT
GLOVE BIO SURGEON STRL SZ8 (GLOVE) ×4 IMPLANT
GLOVE BIOGEL PI IND STRL 7.0 (GLOVE) IMPLANT
GLOVE BIOGEL PI IND STRL 7.5 (GLOVE) ×2 IMPLANT
GLOVE BIOGEL PI IND STRL 8 (GLOVE) ×4 IMPLANT
GLOVE BIOGEL PI INDICATOR 7.0 (GLOVE)
GLOVE BIOGEL PI INDICATOR 7.5 (GLOVE) ×2
GLOVE BIOGEL PI INDICATOR 8 (GLOVE) ×4
GLOVE ECLIPSE 6.5 STRL STRAW (GLOVE) ×8 IMPLANT
GOWN STRL REUS W/ TWL LRG LVL3 (GOWN DISPOSABLE) ×4 IMPLANT
GOWN STRL REUS W/ TWL XL LVL3 (GOWN DISPOSABLE) ×2 IMPLANT
GOWN STRL REUS W/TWL LRG LVL3 (GOWN DISPOSABLE) ×4
GOWN STRL REUS W/TWL XL LVL3 (GOWN DISPOSABLE) ×6 IMPLANT
GUIDEWIRE GAMMA (WIRE) ×4 IMPLANT
GUIDEWIRE GAMMA 800 (WIRE) ×4 IMPLANT
IMMOBILIZER KNEE 22 UNIV (SOFTGOODS) ×4 IMPLANT
IMMOBILIZER KNEE 24 THIGH 36 (MISCELLANEOUS) IMPLANT
IMMOBILIZER KNEE 24 UNIV (MISCELLANEOUS)
NAIL TIBIA STD 9X330MM (Nail) ×4 IMPLANT
NEEDLE HYPO 25X1 1.5 SAFETY (NEEDLE) IMPLANT
NS IRRIG 1000ML POUR BTL (IV SOLUTION) ×4 IMPLANT
PACK BASIN DAY SURGERY FS (CUSTOM PROCEDURE TRAY) ×4 IMPLANT
PAD CAST 4YDX4 CTTN HI CHSV (CAST SUPPLIES) ×2 IMPLANT
PADDING CAST COTTON 4X4 STRL (CAST SUPPLIES) ×2
PENCIL BUTTON HOLSTER BLD 10FT (ELECTRODE) ×4 IMPLANT
REAMER SHAFT BIXCUT (INSTRUMENTS) ×4 IMPLANT
SCREW LOCKING T2 F/T  5MMX45MM (Screw) ×2 IMPLANT
SCREW LOCKING T2 F/T  5MMX55MM (Screw) ×2 IMPLANT
SCREW LOCKING T2 F/T  5X32.5MM (Screw) ×2 IMPLANT
SCREW LOCKING T2 F/T  5X37.5MM (Screw) ×2 IMPLANT
SCREW LOCKING T2 F/T  5X42.5MM (Screw) ×2 IMPLANT
SCREW LOCKING T2 F/T 5MMX45MM (Screw) ×2 IMPLANT
SCREW LOCKING T2 F/T 5MMX55MM (Screw) ×2 IMPLANT
SCREW LOCKING T2 F/T 5X32.5MM (Screw) ×2 IMPLANT
SCREW LOCKING T2 F/T 5X37.5MM (Screw) ×2 IMPLANT
SCREW LOCKING T2 F/T 5X42.5MM (Screw) ×2 IMPLANT
SLEEVE SCD COMPRESS KNEE MED (MISCELLANEOUS) ×4 IMPLANT
SPONGE LAP 18X18 X RAY DECT (DISPOSABLE) ×4 IMPLANT
SPONGE LAP 4X18 X RAY DECT (DISPOSABLE) ×4 IMPLANT
STAPLER VISISTAT 35W (STAPLE) IMPLANT
STOCKINETTE 6  STRL (DRAPES)
STOCKINETTE 6 STRL (DRAPES) IMPLANT
STOCKINETTE IMPERVIOUS LG (DRAPES) IMPLANT
SUCTION FRAZIER HANDLE 10FR (MISCELLANEOUS)
SUCTION TUBE FRAZIER 10FR DISP (MISCELLANEOUS) IMPLANT
SUT ETHILON 3 0 PS 1 (SUTURE) IMPLANT
SUT FIBERWIRE #2 38 T-5 BLUE (SUTURE)
SUT STEEL 7 (SUTURE) IMPLANT
SUT VIC AB 0 CT1 27 (SUTURE) ×6
SUT VIC AB 0 CT1 27XBRD ANBCTR (SUTURE) ×6 IMPLANT
SUT VIC AB 2-0 SH 27 (SUTURE)
SUT VIC AB 2-0 SH 27XBRD (SUTURE) IMPLANT
SUTURE FIBERWR #2 38 T-5 BLUE (SUTURE) IMPLANT
SYR BULB 3OZ (MISCELLANEOUS) ×4 IMPLANT
SYR CONTROL 10ML LL (SYRINGE) ×4 IMPLANT
TUBE CONNECTING 20'X1/4 (TUBING) ×1
TUBE CONNECTING 20X1/4 (TUBING) ×3 IMPLANT
UNDERPAD 30X30 (UNDERPADS AND DIAPERS) ×4 IMPLANT
YANKAUER SUCT BULB TIP NO VENT (SUCTIONS) ×4 IMPLANT

## 2015-09-29 NOTE — Anesthesia Postprocedure Evaluation (Signed)
Anesthesia Post Note  Patient: Kara Hopkins  Procedure(s) Performed: Procedure(s) (LRB): OPEN REDUCTION INTERNAL FIXATION (ORIF) LEFT TIBIA  SHAFT FRACTURE (Left)  Patient location during evaluation: PACU Anesthesia Type: General Level of consciousness: awake and alert and oriented Pain management: pain level controlled Vital Signs Assessment: post-procedure vital signs reviewed and stable Respiratory status: spontaneous breathing, nonlabored ventilation and respiratory function stable Cardiovascular status: blood pressure returned to baseline and stable Postop Assessment: no signs of nausea or vomiting Anesthetic complications: no    Last Vitals:  Filed Vitals:   09/29/15 1011 09/29/15 1015  BP:  141/82  Pulse: 108 103  Temp:    Resp: 18 19    Last Pain:  Filed Vitals:   09/29/15 1025  PainSc: 4                  Orvella Digiulio A.

## 2015-09-29 NOTE — Transfer of Care (Signed)
Immediate Anesthesia Transfer of Care Note  Patient: Kara Hopkins  Procedure(s) Performed: Procedure(s): OPEN REDUCTION INTERNAL FIXATION (ORIF) LEFT TIBIA  SHAFT FRACTURE (Left)  Patient Location: PACU  Anesthesia Type:General  Level of Consciousness: awake, alert , oriented and patient cooperative  Airway & Oxygen Therapy: Patient Spontanous Breathing and Patient connected to face mask oxygen  Post-op Assessment: Report given to RN and Post -op Vital signs reviewed and stable  Post vital signs: Reviewed and stable  Last Vitals:  Filed Vitals:   09/29/15 0722 09/29/15 0915  BP: 161/90 116/73  Pulse: 110 102  Temp:    Resp: 15 20    Complications: No apparent anesthesia complications

## 2015-09-29 NOTE — Interval H&P Note (Signed)
History and Physical Interval Note:  09/29/2015 7:40 AM  Chales SalmonPatricia K Hopkins  has presented today for surgery, with the diagnosis of UNSPECIFED FRACTURE OF SHAFT OF LEFT TIBIAL,INITAIL ENCOUNTER FOR CLOSED FRACTURE  The various methods of treatment have been discussed with the patient and family. After consideration of risks, benefits and other options for treatment, the patient has consented to  Procedure(s): OPEN REDUCTION INTERNAL FIXATION (ORIF) LEFT TIBIA  SHAFT FRACTURE (Left) as a surgical intervention .  The patient's history has been reviewed, patient examined, no change in status, stable for surgery.  I have reviewed the patient's chart and labs.  Questions were answered to the patient's satisfaction.     Beauford Lando D

## 2015-09-29 NOTE — Anesthesia Procedure Notes (Signed)
Procedure Name: LMA Insertion Date/Time: 09/29/2015 7:53 AM Performed by: Halie Gass D Pre-anesthesia Checklist: Patient identified, Emergency Drugs available, Suction available and Patient being monitored Patient Re-evaluated:Patient Re-evaluated prior to inductionOxygen Delivery Method: Circle System Utilized Preoxygenation: Pre-oxygenation with 100% oxygen Intubation Type: IV induction Ventilation: Mask ventilation without difficulty LMA: LMA inserted LMA Size: 4.0 Number of attempts: 1 Airway Equipment and Method: Bite block Placement Confirmation: positive ETCO2 Tube secured with: Tape Dental Injury: Teeth and Oropharynx as per pre-operative assessment

## 2015-09-29 NOTE — Discharge Instructions (Signed)
Bear weight as tolerated in boot.   Keep dressings intact and dry   SACRAL DRESSING (Lower Back)   A pressure ulcer is a sore where the skin breaks open   This dressing will be placed on your lower back to protect this area from pressure and moisture and in many cases helps prevent pressure ulcers from forming   A nurse may place this dressing before your surgery or another procedure   A nurse may also place this dressing if you have other conditions that put you at risk for developing a pressure ulcer   If you are getting up and moving around after surgery, the dressing may be taken off with your first shower or when desired. Simply remove it and throw it away.   While you are in the hospital, nurses will change the dressing twice a week as long as you are still at risk for developing a pressure ulcer   This dressing is latex free and made with silicone (for adhesive sensitivity) so it is safe and gentle to the skin

## 2015-09-29 NOTE — Op Note (Signed)
09/29/2015  8:02 AM  PATIENT:  Kara SalmonPatricia K Hopkins    PRE-OPERATIVE DIAGNOSIS:  UNSPECIFED FRACTURE OF SHAFT OF LEFT TIBIAL,INITAIL ENCOUNTER FOR CLOSED FRACTURE  POST-OPERATIVE DIAGNOSIS:  Same  PROCEDURE:  OPEN REDUCTION INTERNAL FIXATION (ORIF) LEFT TIBIA  SHAFT FRACTURE  SURGEON:  Timiyah Romito, Jewel BaizeIMOTHY D, MD  ASSISTANT: Janace LittenBrandon Parry, OPA-C, present and scrubbed throughout the case, critical for completion in a timely fashion, and for retraction, instrumentation, and closure.   ANESTHESIA:   General  PREOPERATIVE INDICATIONS:  Kara Salmonatricia K Hopkins is a  45 y.o. female with a diagnosis of UNSPECIFED FRACTURE OF SHAFT OF LEFT TIBIAL,INITAIL ENCOUNTER FOR CLOSED FRACTURE who failed conservative measures and elected for surgical management.    The risks benefits and alternatives were discussed with the patient preoperatively including but not limited to the risks of infection, bleeding, nerve injury, cardiopulmonary complications, the need for revision surgery, among others, and the patient was willing to proceed.  OPERATIVE IMPLANTS: Stryker T2 Tibial nail   BLOOD LOSS: minimal  COMPLICATIONS: none  TOURNIQUET TIME: none  OPERATIVE PROCEDURE:  Patient was identified in the preoperative holding area and site was marked by me He was transported to the operating theater and placed on the table in supine position taking care to pad all bony prominences. After a preincinduction time out anesthesia was induced. The left lower extremity was prepped and draped in normal sterile fashion and a pre-incision timeout was performed. Kara SalmonPatricia K Hopkins received ancef for preoperative antibiotics.   The leg was placed over the radiolucent triangle. I then made a 4 cm incision over the patella tendon. I dissected down and incised the patella tendon taking care to not penetrate into the joint itself.   I placed a guidewire under fluoroscopic guidance just medial to lateral tibial spine. I was happy with this  placement on all views. I used the entry reamer to create a path into the proximal tibia staying out of the joint itself.  I then passed the ball tip guidewire down the tibia and across the fracture site. I held appropriate reduction confirmed on multiple views of fluoroscopy and sequentially reamed up to an appropriate size with appropriate chatter. I selected the above-sized nail and passed it down the ball-tipped guidewire and seated it completely to a was sunk into the proximal tibia.  I then used the outrigger placed a static transverse and 2 oblique proximal locking screws. As happy with her length and placement on multiple oblique fluoroscopic views.  Next I confirmed appropriate rotation of the tibia with fracture tease and orientation of the patella and toes. I then used a perfect circles technique to place a distal static interlock screw.  The wounds were then all thoroughly irrigated and closed in layers. Sterile dressing was applied and he was taken to the PACU in stable condition.  POST OPERATIVE PLAN: WBAT in boot, DVT prophylaxis: Early ambulation, SCD's, ASA 325  This note was generated using a template and dragon dictation system. In Suen of that, I have reviewed the note and all aspects of it are applicable to this case. Any dictation errors are due to the computerized dictation system.

## 2015-09-29 NOTE — Anesthesia Preprocedure Evaluation (Addendum)
Anesthesia Evaluation  Patient identified by MRN, date of birth, ID band Patient awake    Reviewed: Allergy & Precautions, NPO status , Patient's Chart, lab work & pertinent test results  Airway Mallampati: III  TM Distance: >3 FB Neck ROM: Full    Dental  (+) Teeth Intact, Caps,    Pulmonary neg pulmonary ROS,    Pulmonary exam normal breath sounds clear to auscultation       Cardiovascular hypertension, Pt. on medications Normal cardiovascular exam Rhythm:Regular Rate:Normal     Neuro/Psych negative neurological ROS  negative psych ROS   GI/Hepatic Neg liver ROS, GERD  Medicated and Controlled,  Endo/Other  diabetes, Type 1, Insulin DependentMorbid obesity  Renal/GU negative Renal ROS  negative genitourinary   Musculoskeletal negative musculoskeletal ROS (+)   Abdominal (+) + obese,   Peds  Hematology   Anesthesia Other Findings   Reproductive/Obstetrics                           Anesthesia Physical Anesthesia Plan  ASA: III  Anesthesia Plan: General   Post-op Pain Management:    Induction: Intravenous  Airway Management Planned: LMA  Additional Equipment:   Intra-op Plan:   Post-operative Plan: Extubation in OR  Informed Consent: I have reviewed the patients History and Physical, chart, labs and discussed the procedure including the risks, benefits and alternatives for the proposed anesthesia with the patient or authorized representative who has indicated his/her understanding and acceptance.   Dental advisory given  Plan Discussed with: CRNA, Anesthesiologist and Surgeon  Anesthesia Plan Comments:        Anesthesia Quick Evaluation

## 2015-09-29 NOTE — Progress Notes (Addendum)
Pt on admission to PACU trying to pull blanket off, putting hands at face and holding arms out. Moaned and groaned with x-ray said "it hurts!"  Pt's affect is flat, c/o pain being 10 /10 after 3 doses of Dialudid, said pain was not better, after 5th dose states pain is 4/10. Pt c/o of dry mouth on admission - wiped her mouth with a cold wet washcloth and offered vaseline for lips but she refused. Gave pt ice chips to help with dry mouth. Offered a drink but pt did not want to sit up to drink.

## 2015-09-30 DIAGNOSIS — S89102A Unspecified physeal fracture of lower end of left tibia, initial encounter for closed fracture: Secondary | ICD-10-CM | POA: Diagnosis not present

## 2015-09-30 LAB — GLUCOSE, CAPILLARY: Glucose-Capillary: 97 mg/dL (ref 65–99)

## 2015-10-02 ENCOUNTER — Encounter (HOSPITAL_BASED_OUTPATIENT_CLINIC_OR_DEPARTMENT_OTHER): Payer: Self-pay | Admitting: Orthopedic Surgery

## 2015-10-02 NOTE — Addendum Note (Signed)
Addendum  created 10/02/15 1051 by Lance CoonWesley Nhyira Leano, CRNA   Modules edited: Charges VN

## 2015-10-04 ENCOUNTER — Encounter (HOSPITAL_BASED_OUTPATIENT_CLINIC_OR_DEPARTMENT_OTHER): Payer: Self-pay | Admitting: Orthopedic Surgery

## 2016-01-02 ENCOUNTER — Other Ambulatory Visit (HOSPITAL_COMMUNITY): Payer: Self-pay | Admitting: Neurosurgery

## 2016-01-02 DIAGNOSIS — S069X1A Unspecified intracranial injury with loss of consciousness of 30 minutes or less, initial encounter: Secondary | ICD-10-CM

## 2016-03-14 LAB — HEMOGLOBIN A1C: Hemoglobin A1C: 7.9

## 2016-06-03 ENCOUNTER — Encounter: Payer: Medicaid Other | Attending: "Endocrinology | Admitting: Nutrition

## 2016-06-03 ENCOUNTER — Encounter (HOSPITAL_COMMUNITY): Payer: Self-pay | Admitting: Emergency Medicine

## 2016-06-03 ENCOUNTER — Ambulatory Visit (INDEPENDENT_AMBULATORY_CARE_PROVIDER_SITE_OTHER): Payer: Medicaid Other | Admitting: "Endocrinology

## 2016-06-03 ENCOUNTER — Emergency Department (HOSPITAL_COMMUNITY)
Admission: EM | Admit: 2016-06-03 | Discharge: 2016-06-04 | Disposition: A | Discharge status: Home / Self Care | Payer: Medicaid Other | Attending: Emergency Medicine | Admitting: Emergency Medicine

## 2016-06-03 ENCOUNTER — Encounter: Payer: Self-pay | Admitting: "Endocrinology

## 2016-06-03 VITALS — BP 129/87 | HR 94 | Ht 66.0 in | Wt 243.0 lb

## 2016-06-03 VITALS — Ht 66.0 in | Wt 243.0 lb

## 2016-06-03 DIAGNOSIS — Z713 Dietary counseling and surveillance: Secondary | ICD-10-CM | POA: Diagnosis present

## 2016-06-03 DIAGNOSIS — E1165 Type 2 diabetes mellitus with hyperglycemia: Secondary | ICD-10-CM

## 2016-06-03 DIAGNOSIS — Z6841 Body Mass Index (BMI) 40.0 and over, adult: Secondary | ICD-10-CM | POA: Diagnosis not present

## 2016-06-03 DIAGNOSIS — Z6839 Body mass index (BMI) 39.0-39.9, adult: Secondary | ICD-10-CM | POA: Diagnosis not present

## 2016-06-03 DIAGNOSIS — Z79899 Other long term (current) drug therapy: Secondary | ICD-10-CM | POA: Diagnosis not present

## 2016-06-03 DIAGNOSIS — IMO0002 Reserved for concepts with insufficient information to code with codable children: Secondary | ICD-10-CM | POA: Insufficient documentation

## 2016-06-03 DIAGNOSIS — E118 Type 2 diabetes mellitus with unspecified complications: Secondary | ICD-10-CM

## 2016-06-03 DIAGNOSIS — E119 Type 2 diabetes mellitus without complications: Secondary | ICD-10-CM | POA: Insufficient documentation

## 2016-06-03 DIAGNOSIS — Z794 Long term (current) use of insulin: Secondary | ICD-10-CM | POA: Insufficient documentation

## 2016-06-03 DIAGNOSIS — J329 Chronic sinusitis, unspecified: Secondary | ICD-10-CM | POA: Diagnosis not present

## 2016-06-03 DIAGNOSIS — E6609 Other obesity due to excess calories: Secondary | ICD-10-CM

## 2016-06-03 DIAGNOSIS — Z791 Long term (current) use of non-steroidal anti-inflammatories (NSAID): Secondary | ICD-10-CM | POA: Diagnosis not present

## 2016-06-03 DIAGNOSIS — I10 Essential (primary) hypertension: Secondary | ICD-10-CM | POA: Diagnosis not present

## 2016-06-03 DIAGNOSIS — J029 Acute pharyngitis, unspecified: Secondary | ICD-10-CM | POA: Diagnosis present

## 2016-06-03 DIAGNOSIS — E669 Obesity, unspecified: Secondary | ICD-10-CM

## 2016-06-03 DIAGNOSIS — IMO0001 Reserved for inherently not codable concepts without codable children: Secondary | ICD-10-CM

## 2016-06-03 LAB — GLUCOSE, POCT (MANUAL RESULT ENTRY): POC Glucose: 209 mg/dl — AB (ref 70–99)

## 2016-06-03 LAB — RAPID STREP SCREEN (MED CTR MEBANE ONLY): Streptococcus, Group A Screen (Direct): NEGATIVE

## 2016-06-03 MED ORDER — CANAGLIFLOZIN 100 MG PO TABS: 100.0000 mg | ORAL_TABLET | Freq: Every day | ORAL | 2 refills | Status: DC

## 2016-06-03 NOTE — ED Triage Notes (Signed)
Pt with nasal drainage that is causing her to have a sore throat. Pt has tried OTC meds but no relief. Pt states she gets sinus infections and that she usually needs a Z pack to get any relief.

## 2016-06-03 NOTE — ED Provider Notes (Signed)
[facility]-EM[facility]ENCY DEPT Provider Note   CSN: [id number] Arrival date & time: 06/03/16  2137     History   Chief Complaint Chief Complaint  Patient presents with  . Sore Throat    HPI Kara Hopkins is a 45 y.o. female.  The history is provided by the patient.  Sore Throat  This is a new problem. The current episode started yesterday. The problem occurs hourly. The problem has been gradually worsening. Pertinent negatives include no abdominal pain and no shortness of breath. The symptoms are aggravated by swallowing. Nothing relieves the symptoms. She has tried nothing for the symptoms. The treatment provided no relief.    Past Medical History:  Diagnosis Date  . Diabetes mellitus without complication (HCC)   . Hyperlipidemia   . Hypertension     Patient Active Problem List   Diagnosis Date Noted  . Uncontrolled type 2 diabetes mellitus with complication (HCC) 06/03/2016  . Essential hypertension, benign 06/03/2016  . Class 3 obesity due to excess calories with serious comorbidity and body mass index (BMI) of 40.0 to 44.9 in adult (HCC) 06/03/2016  . Tibia fracture 09/29/2015    Past Surgical History:  Procedure Laterality Date  . CESAREAN SECTION    . HERNIA REPAIR    . TIBIA IM NAIL INSERTION Left 09/29/2015   Procedure: OPEN REDUCTION INTERNAL FIXATION (ORIF) LEFT TIBIA  SHAFT FRACTURE;  Surgeon: Sheral Apleyimothy D Murphy, MD;  Location: [facility];  Service: Orthopedics;  Laterality: Left;    OB History    No data available       Home Medications    Prior to Admission medications   Medication Sig Start Date End Date Taking? Authorizing Provider  canagliflozin (INVOKANA) 100 MG TABS tablet Take 1 tablet (100 mg total) by mouth daily before breakfast. 06/03/16  Yes Roma KayserGebreselassie W Nida, MD  esomeprazole (NEXIUM) 40 MG capsule Take 40 mg by mouth daily at 12 noon.   Yes Historical Provider, MD  HYDROcodone-acetaminophen (NORCO/VICODIN) 5-325 MG tablet  Take 1 tablet by mouth 3 (three) times daily as needed for moderate pain.   Yes Historical Provider, MD  ibuprofen (ADVIL,MOTRIN) 800 MG tablet Take 800 mg by mouth every 8 (eight) hours as needed for mild pain or moderate pain.    Yes Historical Provider, MD  insulin aspart (NOVOLOG FLEXPEN) 100 UNIT/ML FlexPen Inject 10-16 Units into the skin 3 (three) times daily with meals.   Yes Historical Provider, MD  insulin detemir (LEVEMIR) 100 UNIT/ML injection Inject 60 Units into the skin at bedtime.    Yes Historical Provider, MD  lisinopril (PRINIVIL,ZESTRIL) 10 MG tablet Take 10 mg by mouth daily.   Yes Historical Provider, MD  Phenylephrine-APAP-Guaifenesin (MUCINEX FAST-MAX) 10-650-400 MG/20ML LIQD Take 15 mLs by mouth daily as needed (for cough-congestion).   Yes Historical Provider, MD  pregabalin (LYRICA) 300 MG capsule Take 300 mg by mouth 2 (two) times daily.   Yes Historical Provider, MD    Family History Family History  Problem Relation Age of Onset  . Cancer Father     Social History Social History  Substance Use Topics  . Smoking status: Never Smoker  . Smokeless tobacco: Never Used  . Alcohol use No     Allergies   Parsley seed; Codeine; Sulfa antibiotics; and Penicillins   Review of Systems Review of Systems  HENT: Positive for congestion and sore throat.   Respiratory: Negative for shortness of breath.   Gastrointestinal: Negative for abdominal pain, diarrhea and vomiting.  Skin: Negative for rash.  All other systems reviewed and are negative.    Physical Exam Updated Vital Signs BP 138/95 (BP Location: Left Arm)   Pulse 112   Temp 98.4 F (36.9 C) (Oral)   Resp 20   Ht 5\' 6"  (1.676 m)   Wt 108.9 kg   LMP 05/03/2016   SpO2 100%   BMI 38.74 kg/m   Physical Exam  Constitutional: She is oriented to person, place, and time. She appears well-developed and well-nourished.  Non-toxic appearance.  HENT:  Head: Normocephalic.  Right Ear: Tympanic membrane  and external ear normal.  Left Ear: Tympanic membrane and external ear normal.  Nasal congestion present. Mild increase redness of the posterior pharynx. Uvula midline.  Eyes: EOM and lids are normal. Pupils are equal, round, and reactive to Males.  Neck: Normal range of motion. Neck supple. Carotid bruit is not present.  Cardiovascular: Regular rhythm, normal heart sounds, intact distal pulses and normal pulses.  Tachycardia present.   Pulmonary/Chest: Breath sounds normal. No respiratory distress.  Abdominal: Soft. Bowel sounds are normal. There is no tenderness. There is no guarding.  Musculoskeletal: Normal range of motion.  Lymphadenopathy:       Head (right side): No submandibular adenopathy present.       Head (left side): No submandibular adenopathy present.    She has no cervical adenopathy.  Neurological: She is alert and oriented to person, place, and time. She has normal strength. No cranial nerve deficit or sensory deficit.  Skin: Skin is warm and dry.  Psychiatric: She has a normal mood and affect. Her speech is normal.  Nursing note and vitals reviewed.    ED Treatments / Results  Labs (all labs ordered are listed, but only abnormal results are displayed) Labs Reviewed  RAPID STREP SCREEN (NOT AT [facility])    EKG  EKG Interpretation None       Radiology No results found.  Procedures Procedures (including critical care time)  Medications Ordered in ED Medications - No data to display   Initial Impression / Assessment and Plan / ED Course  I have reviewed the triage vital signs and the nursing notes.  Pertinent labs & imaging results that were available during my care of the patient were reviewed by me and considered in my medical decision making (see chart for details).  Clinical Course     **I have reviewed nursing notes, vital signs, and all appropriate lab and imaging results for this patient.*  Final Clinical Impressions(s) / ED Diagnoses  Vital  signs reviewed. Strep test is negative. Pt advised to wash hands frequently and to increase fluids. She will use afrin for 5 days only. Rx for decadron and zithromax given to the patient. Pt in agreement with plan.   Final diagnoses:  None    New Prescriptions New Prescriptions   No medications on file     Ivery QualeHobson Calianne Larue, PA-C 06/04/16 [phone]    Shon Batonourtney F Horton, MD 06/04/16 [phone]

## 2016-06-03 NOTE — Patient Instructions (Signed)
Goals  Follow Plate Method Exercise 30-60 minutes per day Lose 1 lb per week Cut out snacks between meals Drink only Water Get A1C to 7%

## 2016-06-03 NOTE — Patient Instructions (Signed)

## 2016-06-03 NOTE — Progress Notes (Signed)
  Medical Nutrition Therapy:  Appt start time: 1130 end time:  1200 Assessment:  Primary concerns today: Diabetes. Type 2 . Here with her husband. Walk in from Dr. Fransico HimNida. Taking  60 units Levemir and 15 of Novolog with meals, Invokana 150 mg per day. Eats only 2 meals per day and therefore doesn't take meal time insulin at lunch. Meal times are off schedule also. Motivated to make changes to improve her DM and feel better and lose weight. Stopped Glipizide. Eats three meals per day. Eats out some. Not physical activity right now due to back issues.  Will do safety questions at next visit.   Diet is inconsistent to meet her nutritional needs contributing to her obesity and high blood sugars. Wt Readings from Last 3 Encounters:  06/03/16 240 lb (108.9 kg)  06/03/16 243 lb (110.2 kg)  06/03/16 243 lb (110.2 kg)   Ht Readings from Last 3 Encounters:  06/03/16 5\' 6"  (1.676 m)  06/03/16 5\' 6"  (1.676 m)  06/03/16 5\' 6"  (1.676 m)   Body mass index is 39.22 kg/m.   Lab Results  Component Value Date   HGBA1C 7.9 03/14/2016      Preferred Learning Style      Auditory  Visual  Hands on  No preference indicated    Learning Readiness:   Ready  Change in progress   MEDICATIONS: See list   DIETARY INTAKE:   Eats 2 meals per day.  Usual physical activity: ADL  Estimated energy needs: 1500  calories 170  g carbohydrates 112 g protein 42 g fat  Progress Towards Goal(s):  In progress.   Nutritional Diagnosis:  NB-1.1 Food and nutrition-related knowledge deficit As related to Diabetes.  As evidenced by A1C 7.9%.    Intervention:  Nutrition and Diabetes education provided on My Plate, CHO counting, meal planning, portion sizes, timing of meals, avoiding snacks between meals unless having a low blood sugar, target ranges for A1C and blood sugars, signs/symptoms and treatment of hyper/hypoglycemia, monitoring blood sugars, taking medications as prescribed, benefits of  exercising 30 minutes per day and prevention of complications of DM.    Goals  Follow Plate Method Exercise 30-60 minutes per day Lose 1 lb per week Cut out snacks between meals Drink only Water Get A1C to 7%  Teaching Method Utilized:  Visual Auditory Hands on  Handouts given during visit include:  The Plate Method   Meal Plan Card  Diabetes Instructions.   Barriers to learning/adherence to lifestyle change: none  Demonstrated degree of understanding via:  Teach Back   Monitoring/Evaluation:  Dietary intake, exercise, meal planning, SBG, and body weight in 1 month(s).

## 2016-06-03 NOTE — Progress Notes (Signed)
Subjective:    Patient ID: Kara Hopkins, female    DOB: 21-Oct-1970. Patient is being seen in consultation for management of diabetes requested by  [facility], MD  Past Medical History:  Diagnosis Date  . Diabetes mellitus without complication ([place])   . Hyperlipidemia   . Hypertension    Past Surgical History:  Procedure Laterality Date  . CESAREAN SECTION    . HERNIA REPAIR    . TIBIA IM NAIL INSERTION Left 09/29/2015   Procedure: OPEN REDUCTION INTERNAL FIXATION (ORIF) LEFT TIBIA  SHAFT FRACTURE;  Surgeon: Renette Butters, MD;  Location: [place];  Service: Orthopedics;  Laterality: Left;   Social History   Social History  . Marital status: Married    Spouse name: N/A  . Number of children: N/A  . Years of education: N/A   Social History Main Topics  . Smoking status: Never Smoker  . Smokeless tobacco: Never Used  . Alcohol use No  . Drug use: No  . Sexual activity: Not on file   Other Topics Concern  . Not on file   Social History Narrative  . No narrative on file   Outpatient Encounter Prescriptions as of 06/03/2016  Medication Sig  . esomeprazole (NEXIUM) 40 MG capsule Take 40 mg by mouth daily at 12 noon.  Marland Kitchen ibuprofen (ADVIL,MOTRIN) 800 MG tablet Take 800 mg by mouth every 8 (eight) hours as needed.  . insulin aspart (NOVOLOG FLEXPEN) 100 UNIT/ML FlexPen Inject 10-16 Units into the skin 3 (three) times daily with meals.  . insulin detemir (LEVEMIR) 100 UNIT/ML injection Inject 60 Units into the skin at bedtime. Patient states she is taking 80 units   . lisinopril (PRINIVIL,ZESTRIL) 10 MG tablet Take 10 mg by mouth daily.  Marland Kitchen oxyCODONE-acetaminophen (ROXICET) 5-325 MG tablet Take 1-2 tablets by mouth every 4 (four) hours as needed.  . pregabalin (LYRICA) 300 MG capsule Take 300 mg by mouth 2 (two) times daily.  . [DISCONTINUED] canagliflozin (INVOKANA) 300 MG TABS tablet Take 300 mg by mouth daily before breakfast.  . [DISCONTINUED]  glipiZIDE (GLUCOTROL) 5 MG tablet Take 5 mg by mouth 2 (two) times daily before a meal.  . canagliflozin (INVOKANA) 100 MG TABS tablet Take 1 tablet (100 mg total) by mouth daily before breakfast.  . [DISCONTINUED] docusate sodium (COLACE) 100 MG capsule Take 1 capsule (100 mg total) by mouth 2 (two) times daily. Continue this while taking narcotics to help with bowel movements  . [DISCONTINUED] insulin lispro (HUMALOG) 100 UNIT/ML injection Inject 2-8 Units into the skin 3 (three) times daily before meals. Per sliding scale  . [DISCONTINUED] ondansetron (ZOFRAN ODT) 4 MG disintegrating tablet Take 1 tablet (4 mg total) by mouth every 8 (eight) hours as needed for nausea or vomiting.  . [DISCONTINUED] PRESCRIPTION MEDICATION Apply 1 application topically 2 (two) times daily. Medication: Cream: Ingredients include Doxepin 5%, Gabapentin powder 2%, Lidocaine Oint 5%, Nifedipine Powder 2%, Pentoxifylline Powder 2%.   No facility-administered encounter medications on file as of 06/03/2016.    ALLERGIES: Allergies  Allergen Reactions  . Parsley Seed Anaphylaxis  . Codeine     Swelling   . Sulfa Antibiotics     Rash   . Penicillins Rash   VACCINATION STATUS:  There is no immunization history on file for this patient.  Diabetes  She presents for her initial diabetic visit. She has type 2 diabetes mellitus. Onset time: She was diagnosed at approximate age of 55 years preceded  by episodes of gestational diabetes on 2 separate pregnancies in her 19s. Her disease course has been worsening. There are no hypoglycemic associated symptoms. Pertinent negatives for hypoglycemia include no confusion, headaches, pallor or seizures. Associated symptoms include fatigue, polydipsia and polyuria. Pertinent negatives for diabetes include no chest pain and no polyphagia. There are no hypoglycemic complications. Symptoms are worsening. Diabetic complications include peripheral neuropathy. Risk factors for coronary  artery disease include dyslipidemia, diabetes mellitus, tobacco exposure, sedentary lifestyle, obesity and hypertension. Current diabetic treatments: She is on Levemir 80 units 3 times a day and NovoLog sliding scale, invokana 300 mg by mouth daily, and glipizide 5 mg twice a day. Her weight is increasing steadily. She is following a generally unhealthy diet. When asked about meal planning, she reported none. She has not had a previous visit with a dietitian. She rarely participates in exercise. Home blood sugar record trend: She did not bring any meter nor logs to review today. She admits that she does not monitor blood glucose regularly. An ACE inhibitor/angiotensin II receptor blocker is not being taken. Eye exam is current.  Hypertension  This is a chronic problem. The current episode started more than 1 year ago. The problem is controlled. Pertinent negatives include no chest pain, headaches, palpitations or shortness of breath. Risk factors for coronary artery disease include obesity, dyslipidemia, diabetes mellitus, sedentary lifestyle, smoking/tobacco exposure and family history.  Hyperlipidemia  This is a chronic problem. The current episode started more than 1 year ago. Exacerbating diseases include diabetes and obesity. Pertinent negatives include no chest pain, myalgias or shortness of breath. She is currently on no antihyperlipidemic treatment. Risk factors for coronary artery disease include dyslipidemia, diabetes mellitus, hypertension, obesity and a sedentary lifestyle.      Review of Systems  Constitutional: Positive for fatigue. Negative for chills, fever and unexpected weight change.  HENT: Negative for trouble swallowing and voice change.   Eyes: Negative for visual disturbance.  Respiratory: Negative for cough, shortness of breath and wheezing.   Cardiovascular: Negative for chest pain, palpitations and leg swelling.  Gastrointestinal: Negative for diarrhea, nausea and vomiting.   Endocrine: Positive for polydipsia and polyuria. Negative for cold intolerance, heat intolerance and polyphagia.  Musculoskeletal: Negative for arthralgias and myalgias.  Skin: Negative for color change, pallor, rash and wound.  Neurological: Negative for seizures and headaches.  Psychiatric/Behavioral: Negative for confusion and suicidal ideas.    Objective:    BP 129/87   Pulse 94   Ht '5\' 6"'  (1.676 m)   Wt 243 lb (110.2 kg)   BMI 39.22 kg/m   Wt Readings from Last 3 Encounters:  06/03/16 243 lb (110.2 kg)  06/03/16 243 lb (110.2 kg)  09/29/15 228 lb 2 oz (103.5 kg)    Physical Exam  Constitutional: She is oriented to person, place, and time. She appears well-developed.  HENT:  Head: Normocephalic and atraumatic.  Eyes: EOM are normal.  Neck: Normal range of motion. Neck supple. No tracheal deviation present. No thyromegaly present.  Cardiovascular: Normal rate and regular rhythm.   Pulmonary/Chest: Effort normal and breath sounds normal.  Abdominal: Soft. Bowel sounds are normal. There is no tenderness. There is no guarding.  Musculoskeletal: Normal range of motion. She exhibits no edema.  Neurological: She is alert and oriented to person, place, and time. She has normal reflexes. No cranial nerve deficit. Coordination normal.  Skin: Skin is warm and dry. No rash noted. No erythema. No pallor.  Psychiatric: She has a normal mood  and affect. Judgment normal.    CMP     Component Value Date/Time   NA 138 09/29/2015 0703   K 3.6 09/29/2015 0703   CL 106 09/29/2015 0703   CO2 23 03/20/2010 1114   GLUCOSE 243 (H) 09/29/2015 0703   BUN 8 09/29/2015 0703   CREATININE 0.50 09/29/2015 0703   CALCIUM 9.8 03/20/2010 1114   PROT 7.0 03/14/2010 1927   ALBUMIN 3.7 03/14/2010 1927   AST 18 03/14/2010 1927   ALT 16 03/14/2010 1927   ALKPHOS 68 03/14/2010 1927   BILITOT 0.4 03/14/2010 1927   GFRNONAA >60 03/20/2010 1114   GFRAA  03/20/2010 1114    >60        The eGFR has  been calculated using the MDRD equation. This calculation has not been validated in all clinical situations. eGFR's persistently <60 mL/min signify possible Chronic Kidney Disease.     Assessment & Plan:   1. Uncontrolled type 2 diabetes mellitus with complication, unspecified long term insulin use status ([place])  - Patient has currently uncontrolled symptomatic type 2 DM since  45 years of age,  with most recent A1c of 7.9 % from 03/14/2016.  - No recent labs to review.   Her diabetes is complicated by peripheral neuropathy on Lyrica and patient remains at a high risk for more acute and chronic complications of diabetes which include CAD, CVA, CKD, retinopathy, and neuropathy. These are all discussed in detail with the patient.  - I have counseled the patient on diet management and weight loss, by adopting a carbohydrate restricted/protein rich diet.  - Suggestion is made for patient to avoid simple carbohydrates   from their diet including Cakes , Desserts, Ice Cream,  Soda (  diet and regular) , Sweet Tea , Candies,  Chips, Cookies, Artificial Sweeteners,   and "Sugar-free" Products . This will help patient to have stable blood glucose profile and potentially avoid unintended weight gain.  - I encouraged the patient to switch to  unprocessed or minimally processed complex starch and increased protein intake (animal or plant source), fruits, and vegetables.  - Patient is advised to stick to a routine mealtimes to eat 3 meals  a day and avoid unnecessary snacks ( to snack only to correct hypoglycemia).  - The patient will be scheduled with Jearld Fenton, RDN, CDE for individualized DM education.  - I have approached patient with the following individualized plan to manage diabetes and patient agrees:   - I  will proceed to adjust her basal/bolus insulin as follows: I advised her to decrease her basal insulin Levemir to 60 units QHS, and  increase her prandial insulin NovoLog to 10  units TIDAC for pre-meal BG readings of 90-133m/dl, plus patient specific correction dose for unexpected hyperglycemia above 1526mdl, associated with strict monitoring of glucose  AC and HS. - Patient is warned not to take insulin without proper monitoring per orders. -Adjustment parameters are given for hypo and hyperglycemia in writing. -Patient is encouraged to call clinic for blood glucose levels less than 70 or above 300 mg /dl. - I will lower Invokana to 15035mo qday ( half pill of 300m107mll every day until she finishes her current supplies) , therapeutically suitable for patient. - I will discontinue glipizide, risk outweighs benefit for this patient.  - Patient will be considered for incretin therapy as appropriate next visit. - Patient specific target  A1c;  LDL, HDL, Triglycerides, and  Waist Circumference were discussed in detail.  2)  BP/HTN: Controlled. Continue current medications including ACEI/ARB. 3) Lipids/HPL:  Control unknown, I will obtain fasting lipid panel on subsequent visits. Patient is not on statins. 4)  Weight/Diet: CDE Consult will be initiated , exercise, and detailed carbohydrates information provided.  5) Chronic Care/Health Maintenance:  -Patient is on ACEI/ARB  medications and encouraged to continue to follow up with Ophthalmology, Podiatrist at least yearly or according to recommendations, and advised to   stay away from smoking. I have recommended yearly flu vaccine and pneumonia vaccination at least every 5 years; moderate intensity exercise for up to 150 minutes weekly; and  sleep for at least 7 hours a day.  - 60 minutes of time was spent on the care of this patient , 50% of which was applied for counseling on diabetes complications and their preventions.  - Patient to bring meter and  blood glucose logs during their next visit.   - I advised patient to maintain close follow up with [facility], MD for primary care needs.  Follow up plan: - Return  in about 5 weeks (around 07/08/2016) for meter, and logs.  Glade Lloyd, MD Phone: [phone]  Fax: [phone]   06/03/2016, 4:16 PM

## 2016-06-04 MED ORDER — AZITHROMYCIN 250 MG PO TABS
500.0000 mg | ORAL_TABLET | Freq: Once | ORAL | Status: AC
  Administered 2016-06-04: 500 mg via ORAL
  Filled 2016-06-04: qty 2

## 2016-06-04 MED ORDER — IBUPROFEN 800 MG PO TABS
800.0000 mg | ORAL_TABLET | Freq: Once | ORAL | Status: AC
  Administered 2016-06-04: 800 mg via ORAL
  Filled 2016-06-04: qty 1

## 2016-06-04 MED ORDER — PREDNISONE 20 MG PO TABS
40.0000 mg | ORAL_TABLET | Freq: Once | ORAL | Status: AC
Start: 2016-06-04 — End: 2016-06-04
  Administered 2016-06-04: 40 mg via ORAL
  Filled 2016-06-04: qty 2

## 2016-06-04 MED ORDER — OXYMETAZOLINE HCL 0.05 % NA SOLN
1.0000 | Freq: Once | NASAL | Status: AC
  Administered 2016-06-04: 1 via NASAL
  Filled 2016-06-04: qty 15

## 2016-06-04 MED ORDER — DEXAMETHASONE 4 MG PO TABS: 4.0000 mg | ORAL_TABLET | Freq: Two times a day (BID) | ORAL | 0 refills | Status: DC

## 2016-06-04 MED ORDER — AZITHROMYCIN 250 MG PO TABS: ORAL_TABLET | ORAL | 0 refills | Status: DC

## 2016-06-04 NOTE — Discharge Instructions (Signed)
Pulse elevated, but the remainder of the vital signs are within normal limits. Strep test is negative. Use a squirt of afrin to each nostril every 8 hours for 5 days only. Increase fluids. Use zithromax daily. Use decadron daily with food. Use ibuprofen every 6 hours for pain or fever. Wash hands frequently.

## 2016-06-06 LAB — CULTURE, GROUP A STREP (THRC)

## 2016-07-16 ENCOUNTER — Ambulatory Visit: Payer: Worker's Compensation | Admitting: Nutrition

## 2016-07-16 ENCOUNTER — Ambulatory Visit: Payer: Medicaid Other | Admitting: "Endocrinology

## 2016-11-26 ENCOUNTER — Ambulatory Visit: Payer: Medicaid Other | Admitting: "Endocrinology

## 2016-11-26 LAB — LIPID PANEL
Cholesterol: 203 mg/dL — AB (ref 0–200)
HDL: 34 mg/dL — AB (ref 35–70)
Triglycerides: 744 mg/dL — AB (ref 40–160)

## 2016-11-26 LAB — TSH: TSH: 1.88 u[IU]/mL (ref <=5.90)

## 2016-11-26 LAB — HEMOGLOBIN A1C: Hemoglobin A1C: 9.3

## 2016-12-03 ENCOUNTER — Ambulatory Visit (INDEPENDENT_AMBULATORY_CARE_PROVIDER_SITE_OTHER): Payer: Medicaid Other | Admitting: "Endocrinology

## 2016-12-03 ENCOUNTER — Encounter: Payer: Self-pay | Admitting: "Endocrinology

## 2016-12-03 VITALS — BP 120/90 | HR 109 | Ht 66.0 in | Wt 247.0 lb

## 2016-12-03 DIAGNOSIS — Z794 Long term (current) use of insulin: Secondary | ICD-10-CM

## 2016-12-03 DIAGNOSIS — IMO0002 Reserved for concepts with insufficient information to code with codable children: Secondary | ICD-10-CM

## 2016-12-03 DIAGNOSIS — E559 Vitamin D deficiency, unspecified: Secondary | ICD-10-CM

## 2016-12-03 DIAGNOSIS — Z9119 Patient's noncompliance with other medical treatment and regimen: Secondary | ICD-10-CM | POA: Insufficient documentation

## 2016-12-03 DIAGNOSIS — I10 Essential (primary) hypertension: Secondary | ICD-10-CM

## 2016-12-03 DIAGNOSIS — E1165 Type 2 diabetes mellitus with hyperglycemia: Secondary | ICD-10-CM | POA: Diagnosis not present

## 2016-12-03 DIAGNOSIS — Z91199 Patient's noncompliance with other medical treatment and regimen due to unspecified reason: Secondary | ICD-10-CM

## 2016-12-03 DIAGNOSIS — E118 Type 2 diabetes mellitus with unspecified complications: Secondary | ICD-10-CM | POA: Diagnosis not present

## 2016-12-03 MED ORDER — VITAMIN D3 125 MCG (5000 UT) PO CAPS: 5000.0000 [IU] | ORAL_CAPSULE | Freq: Every day | ORAL | 0 refills | Status: DC

## 2016-12-03 MED ORDER — OMEGA-3-ACID ETHYL ESTERS 1 G PO CAPS: 2.0000 g | ORAL_CAPSULE | Freq: Two times a day (BID) | ORAL | 2 refills | Status: DC

## 2016-12-03 NOTE — Progress Notes (Signed)
Subjective:    Patient ID: Kara Hopkins, female    DOB: 03-Jun-1971. Patient is being seen in f/u for management of diabetes requested by  Kirstie Peri, MD  Past Medical History:  Diagnosis Date  . Diabetes mellitus without complication (HCC)   . Hyperlipidemia   . Hypertension    Past Surgical History:  Procedure Laterality Date  . CESAREAN SECTION    . HERNIA REPAIR    . TIBIA IM NAIL INSERTION Left 09/29/2015   Procedure: OPEN REDUCTION INTERNAL FIXATION (ORIF) LEFT TIBIA  SHAFT FRACTURE;  Surgeon: Sheral Apley, MD;  Location: [facility];  Service: Orthopedics;  Laterality: Left;   Social History   Social History  . Marital status: Married    Spouse name: N/A  . Number of children: N/A  . Years of education: N/A   Social History Main Topics  . Smoking status: Never Smoker  . Smokeless tobacco: Never Used  . Alcohol use No  . Drug use: No  . Sexual activity: Not Asked   Other Topics Concern  . None   Social History Narrative  . None   Outpatient Encounter Prescriptions as of 12/03/2016  Medication Sig  . [DISCONTINUED] Exenatide ER (BYDUREON) 2 MG PEN Inject into the skin once a week.  . Cholecalciferol (VITAMIN D3) 5000 units CAPS Take 1 capsule (5,000 Units total) by mouth daily.  Marland Kitchen dexamethasone (DECADRON) 4 MG tablet Take 1 tablet (4 mg total) by mouth 2 (two) times daily with a meal.  . esomeprazole (NEXIUM) 40 MG capsule Take 40 mg by mouth daily at 12 noon.  Marland Kitchen HYDROcodone-acetaminophen (NORCO/VICODIN) 5-325 MG tablet Take 1 tablet by mouth 3 (three) times daily as needed for moderate pain.  Marland Kitchen ibuprofen (ADVIL,MOTRIN) 800 MG tablet Take 800 mg by mouth every 8 (eight) hours as needed for mild pain or moderate pain.   Marland Kitchen insulin aspart (NOVOLOG FLEXPEN) 100 UNIT/ML FlexPen Inject 10-16 Units into the skin 3 (three) times daily with meals.  . insulin detemir (LEVEMIR) 100 UNIT/ML injection Inject 60 Units into the skin at bedtime.   Marland Kitchen  lisinopril (PRINIVIL,ZESTRIL) 10 MG tablet Take 10 mg by mouth daily.  Marland Kitchen Phenylephrine-APAP-Guaifenesin (MUCINEX FAST-MAX) 10-650-400 MG/20ML LIQD Take 15 mLs by mouth daily as needed (for cough-congestion).  . pregabalin (LYRICA) 300 MG capsule Take 300 mg by mouth 2 (two) times daily.  . [DISCONTINUED] azithromycin (ZITHROMAX) 250 MG tablet 1 po daily  . [DISCONTINUED] canagliflozin (INVOKANA) 100 MG TABS tablet Take 1 tablet (100 mg total) by mouth daily before breakfast.   No facility-administered encounter medications on file as of 12/03/2016.    ALLERGIES: Allergies  Allergen Reactions  . Parsley Seed Anaphylaxis  . Codeine     Swelling   . Sulfa Antibiotics     Rash   . Penicillins Rash   VACCINATION STATUS:  There is no immunization history on file for this patient.  Diabetes  She presents for her follow-up diabetic visit. She has type 2 diabetes mellitus. Onset time: She was diagnosed at approximate age of 32 years preceded by episodes of gestational diabetes on 2 separate pregnancies in her 25s. Her disease course has been worsening (She was seen in November 2017, was supposed to return in 5 weeks , no showed until today). There are no hypoglycemic associated symptoms. Pertinent negatives for hypoglycemia include no confusion, headaches, pallor or seizures. Associated symptoms include fatigue, polydipsia and polyuria. Pertinent negatives for diabetes include no chest pain  and no polyphagia. There are no hypoglycemic complications. Symptoms are worsening. Diabetic complications include peripheral neuropathy. (She says she was hospitalized due to pancreatitis last month.) Risk factors for coronary artery disease include dyslipidemia, diabetes mellitus, tobacco exposure, sedentary lifestyle, obesity and hypertension. Current diabetic treatments: She is on Levemir 80 units 3 times a day and NovoLog sliding scale, invokana 300 mg by mouth daily, and glipizide 5 mg twice a day. She is  compliant with treatment none of the time. Her weight is stable. She is following a generally unhealthy diet. When asked about meal planning, she reported none. She has not had a previous visit with a dietitian. She rarely participates in exercise. Home blood sugar record trend: She did bring a log showing 1 week worth of readings, showing persistently elevated blood glucose profile. Her breakfast blood glucose range is generally >200 mg/dl. Her lunch blood glucose range is generally >200 mg/dl. Her dinner blood glucose range is generally >200 mg/dl. Her bedtime blood glucose range is generally >200 mg/dl. An ACE inhibitor/angiotensin II receptor blocker is not being taken. Eye exam is current.  Hypertension  This is a chronic problem. The current episode started more than 1 year ago. The problem is controlled. Pertinent negatives include no chest pain, headaches, palpitations or shortness of breath. Risk factors for coronary artery disease include obesity, dyslipidemia, diabetes mellitus, sedentary lifestyle, smoking/tobacco exposure and family history.  Hyperlipidemia  This is a chronic problem. The current episode started more than 1 year ago. The problem is uncontrolled (Triglycerides 744). Exacerbating diseases include diabetes and obesity. Pertinent negatives include no chest pain, myalgias or shortness of breath. She is currently on no antihyperlipidemic treatment. Risk factors for coronary artery disease include dyslipidemia, diabetes mellitus, hypertension, obesity and a sedentary lifestyle.     Review of Systems  Constitutional: Positive for fatigue. Negative for chills, fever and unexpected weight change.  HENT: Negative for trouble swallowing and voice change.   Eyes: Negative for visual disturbance.  Respiratory: Negative for cough, shortness of breath and wheezing.   Cardiovascular: Negative for chest pain, palpitations and leg swelling.  Gastrointestinal: Negative for diarrhea, nausea  and vomiting.  Endocrine: Positive for polydipsia and polyuria. Negative for cold intolerance, heat intolerance and polyphagia.  Musculoskeletal: Negative for arthralgias and myalgias.  Skin: Negative for color change, pallor, rash and wound.  Neurological: Negative for seizures and headaches.  Psychiatric/Behavioral: Negative for confusion and suicidal ideas.    Objective:    BP 120/90   Pulse (!) 109   Ht 5\' 6"  (1.676 m)   Wt 247 lb (112 kg)   BMI 39.87 kg/m   Wt Readings from Last 3 Encounters:  12/03/16 247 lb (112 kg)  06/03/16 240 lb (108.9 kg)  06/03/16 243 lb (110.2 kg)    Physical Exam  Constitutional: She is oriented to person, place, and time. She appears well-developed.  HENT:  Head: Normocephalic and atraumatic.  Eyes: EOM are normal.  Neck: Normal range of motion. Neck supple. No tracheal deviation present. No thyromegaly present.  Cardiovascular: Normal rate and regular rhythm.   Pulmonary/Chest: Effort normal and breath sounds normal.  Abdominal: Soft. Bowel sounds are normal. There is no tenderness. There is no guarding.  Musculoskeletal: Normal range of motion. She exhibits no edema.  Neurological: She is alert and oriented to person, place, and time. She has normal reflexes. No cranial nerve deficit. Coordination normal.  Skin: Skin is warm and dry. No rash noted. No erythema. No pallor.  Psychiatric: She  has a normal mood and affect. Judgment normal.   Recent Results (from the past 2160 hour(s))  Lipid panel     Status: Abnormal   Collection Time: 11/26/16 12:00 AM  Result Value Ref Range   Triglycerides 744 (A) 40 - 160 mg/dL   Cholesterol 952203 (A) 0 - 200 mg/dL   HDL 34 (A) 35 - 70 mg/dL  Hemoglobin [id number]     Status: None   Collection Time: 11/26/16 12:00 AM  Result Value Ref Range   Hemoglobin A1C 9.3   TSH     Status: None   Collection Time: 11/26/16 12:00 AM  Result Value Ref Range   TSH 1.88 0.41 - 5.90 uIU/mL    Comment: free t4 1.09       Assessment & Plan:   1. Uncontrolled type 2 diabetes mellitus with complication, unspecified long term insulin use status (HCC)  - Patient has currently uncontrolled symptomatic type 2 DM since  46 years of age. - Patient loss control, did not show up for her appointments since November, returns with severe hyperglycemia. - Her a1c ,  a1c has worsened to 9.3% from  7.9 % .  -  Recent labs reviewed, normal renal function. She has profound vitamin D deficiency and profound hypertriglyceridemia.   Her diabetes is complicated by peripheral neuropathy on Lyrica and patient remains at a high risk for more acute and chronic complications of diabetes which include CAD, CVA, CKD, retinopathy, and neuropathy. These are all discussed in detail with the patient.  - I have counseled the patient on diet management and weight loss, by adopting a carbohydrate restricted/protein rich diet.  - Suggestion is made for patient to avoid simple carbohydrates   from their diet including Cakes , Desserts, Ice Cream,  Soda (  diet and regular) , Sweet Tea , Candies,  Chips, Cookies, Artificial Sweeteners,   and "Sugar-free" Products . This will help patient to have stable blood glucose profile and potentially avoid unintended weight gain.  - I encouraged the patient to switch to  unprocessed or minimally processed complex starch and increased protein intake (animal or plant source), fruits, and vegetables.  - Patient is advised to stick to a routine mealtimes to eat 3 meals  a day and avoid unnecessary snacks ( to snack only to correct hypoglycemia).  - The patient will be scheduled with Norm SaltPenny Crumpton, RDN, CDE for individualized DM education.  - I have approached patient with the following individualized plan to manage diabetes and patient agrees:   -  Unfortunately she has not committed for regular follow-up and disappeared from care since November 2017 and returns with loss of control with A1c of 9.3%  interval hospitalizations due to pancreatitis.  - I urged her to reengage. Based on her glycemic burden she will continue to need basal/bolus insulin.  - I will proceed with  Levemir 60 units daily at bedtime, and NovoLog  10 units TIDAC for pre-meal BG readings of 90-150mg /dl, plus patient specific correction dose for unexpected hyperglycemia above 150mg /dl, associated with strict monitoring of glucose  AC and HS. - Patient is warned not to take insulin without proper monitoring per orders. -Adjustment parameters are given for hypo and hyperglycemia in writing. -Patient is encouraged to call clinic for blood glucose levels less than 70 or above 300 mg /dl. - She did not tolerate Invokana which she has stopped already, she is not a good candidate for Bydureon given her current triglyceridemia of 744 and recent history  of pancreatitis.   I advised her to discontinue..  - Patient specific target  A1c;  LDL, HDL, Triglycerides, and  Waist Circumference were discussed in detail.  2) BP/HTN: Controlled. Continue current medications including ACEI/ARB. 3) Lipids/HPL:   uncontrolled, triglycerides 744,  I would initiate Lovaza 2 g by mouth twice a day, she will be considered for additional statin next visit. Marland Kitchen 4)  Weight/Diet: CDE Consult has been  initiated , exercise, and detailed carbohydrates information provided. 5) vitamin D deficiency: Vitamin D at 8 - I discussed and added prescriptions stings vitamin D 50,000 units weekly for 12 weeks.  6) Chronic Care/Health Maintenance:  -Patient is on ACEI/ARB  medications and encouraged to continue to follow up with Ophthalmology, Podiatrist at least yearly or according to recommendations, and advised to   stay away from smoking. I have recommended yearly flu vaccine and pneumonia vaccination at least every 5 years; moderate intensity exercise for up to 150 minutes weekly; and  sleep for at least 7 hours a day.  - 30 minutes of time was spent on the care of  this patient , 50% of which was applied for counseling on diabetes complications and their preventions.  - Patient to bring meter and  blood glucose logs during their her visit.   - I advised patient to maintain close follow up with Kirstie Peri, MD for primary care needs.  Follow up plan: - Return in about 1 week (around 12/10/2016) for follow up with meter and logs- no labs.  Marquis Lunch, MD Phone: [phone]  Fax: [phone]   12/03/2016, 1:39 PM

## 2016-12-03 NOTE — Patient Instructions (Signed)

## 2016-12-11 ENCOUNTER — Encounter: Payer: Self-pay | Admitting: "Endocrinology

## 2016-12-11 ENCOUNTER — Ambulatory Visit (INDEPENDENT_AMBULATORY_CARE_PROVIDER_SITE_OTHER): Payer: Medicaid Other | Admitting: "Endocrinology

## 2016-12-11 VITALS — BP 133/86 | HR 118 | Ht 66.0 in | Wt 250.0 lb

## 2016-12-11 DIAGNOSIS — I10 Essential (primary) hypertension: Secondary | ICD-10-CM

## 2016-12-11 DIAGNOSIS — IMO0002 Reserved for concepts with insufficient information to code with codable children: Secondary | ICD-10-CM

## 2016-12-11 DIAGNOSIS — Z794 Long term (current) use of insulin: Secondary | ICD-10-CM | POA: Diagnosis not present

## 2016-12-11 DIAGNOSIS — E6609 Other obesity due to excess calories: Secondary | ICD-10-CM

## 2016-12-11 DIAGNOSIS — Z6841 Body Mass Index (BMI) 40.0 and over, adult: Secondary | ICD-10-CM | POA: Diagnosis not present

## 2016-12-11 DIAGNOSIS — E1165 Type 2 diabetes mellitus with hyperglycemia: Secondary | ICD-10-CM | POA: Diagnosis not present

## 2016-12-11 DIAGNOSIS — IMO0001 Reserved for inherently not codable concepts without codable children: Secondary | ICD-10-CM

## 2016-12-11 DIAGNOSIS — E118 Type 2 diabetes mellitus with unspecified complications: Secondary | ICD-10-CM

## 2016-12-11 MED ORDER — INSULIN DETEMIR 100 UNIT/ML FLEXPEN: 80.0000 [IU] | PEN_INJECTOR | Freq: Every day | SUBCUTANEOUS | 2 refills | Status: DC

## 2016-12-11 NOTE — Progress Notes (Signed)
Subjective:    Patient ID: Kara Hopkins, female    DOB: 11-13-1970. Patient is being seen in f/u for management of diabetes requested by  Kirstie Peri, MD  Past Medical History:  Diagnosis Date  . Diabetes mellitus without complication (HCC)   . Hyperlipidemia   . Hypertension    Past Surgical History:  Procedure Laterality Date  . CESAREAN SECTION    . HERNIA REPAIR    . TIBIA IM NAIL INSERTION Left 09/29/2015   Procedure: OPEN REDUCTION INTERNAL FIXATION (ORIF) LEFT TIBIA  SHAFT FRACTURE;  Surgeon: Sheral Apley, MD;  Location: [facility];  Service: Orthopedics;  Laterality: Left;   Social History   Social History  . Marital status: Married    Spouse name: N/A  . Number of children: N/A  . Years of education: N/A   Social History Main Topics  . Smoking status: Never Smoker  . Smokeless tobacco: Never Used  . Alcohol use No  . Drug use: No  . Sexual activity: Not on file   Other Topics Concern  . Not on file   Social History Narrative  . No narrative on file   Outpatient Encounter Prescriptions as of 12/11/2016  Medication Sig  . Cholecalciferol (VITAMIN D3) 5000 units CAPS Take 1 capsule (5,000 Units total) by mouth daily.  Marland Kitchen dexamethasone (DECADRON) 4 MG tablet Take 1 tablet (4 mg total) by mouth 2 (two) times daily with a meal.  . esomeprazole (NEXIUM) 40 MG capsule Take 40 mg by mouth daily at 12 noon.  Marland Kitchen HYDROcodone-acetaminophen (NORCO/VICODIN) 5-325 MG tablet Take 1 tablet by mouth 3 (three) times daily as needed for moderate pain.  Marland Kitchen ibuprofen (ADVIL,MOTRIN) 800 MG tablet Take 800 mg by mouth every 8 (eight) hours as needed for mild pain or moderate pain.   Marland Kitchen insulin aspart (NOVOLOG FLEXPEN) 100 UNIT/ML FlexPen Inject 15-21 Units into the skin 3 (three) times daily with meals.  . Insulin Detemir (LEVEMIR FLEXTOUCH) 100 UNIT/ML Pen Inject 80 Units into the skin daily at 10 pm.  . insulin detemir (LEVEMIR) 100 UNIT/ML injection Inject 80  Units into the skin at bedtime.  Marland Kitchen lisinopril (PRINIVIL,ZESTRIL) 10 MG tablet Take 10 mg by mouth daily.  Marland Kitchen omega-3 acid ethyl esters (LOVAZA) 1 g capsule Take 2 capsules (2 g total) by mouth 2 (two) times daily.  Marland Kitchen Phenylephrine-APAP-Guaifenesin (MUCINEX FAST-MAX) 10-650-400 MG/20ML LIQD Take 15 mLs by mouth daily as needed (for cough-congestion).  . pregabalin (LYRICA) 300 MG capsule Take 300 mg by mouth 2 (two) times daily.   No facility-administered encounter medications on file as of 12/11/2016.    ALLERGIES: Allergies  Allergen Reactions  . Parsley Seed Anaphylaxis  . Codeine     Swelling   . Sulfa Antibiotics     Rash   . Penicillins Rash   VACCINATION STATUS:  There is no immunization history on file for this patient.  Diabetes  She presents for her follow-up diabetic visit. She has type 2 diabetes mellitus. Onset time: She was diagnosed at approximate age of 32 years preceded by episodes of gestational diabetes on 2 separate pregnancies in her 50s. Her disease course has been worsening. There are no hypoglycemic associated symptoms. Pertinent negatives for hypoglycemia include no confusion, headaches, pallor or seizures. Associated symptoms include fatigue, polydipsia and polyuria. Pertinent negatives for diabetes include no chest pain and no polyphagia. There are no hypoglycemic complications. Symptoms are worsening. Diabetic complications include peripheral neuropathy. (She says she  was hospitalized due to pancreatitis last month.) Risk factors for coronary artery disease include dyslipidemia, diabetes mellitus, tobacco exposure, sedentary lifestyle, obesity and hypertension. Current diabetic treatments: She is on Levemir 80 units 3 times a day and NovoLog sliding scale, invokana 300 mg by mouth daily, and glipizide 5 mg twice a day. She is compliant with treatment none of the time. Her weight is increasing steadily. She is following a generally unhealthy diet. When asked about meal  planning, she reported none. She has not had a previous visit with a dietitian. She rarely participates in exercise. Her breakfast blood glucose range is generally >200 mg/dl. Her lunch blood glucose range is generally >200 mg/dl. Her dinner blood glucose range is generally >200 mg/dl. Her bedtime blood glucose range is generally >200 mg/dl. An ACE inhibitor/angiotensin II receptor blocker is not being taken. Eye exam is current.  Hypertension  This is a chronic problem. The current episode started more than 1 year ago. The problem is controlled. Pertinent negatives include no chest pain, headaches, palpitations or shortness of breath. Risk factors for coronary artery disease include obesity, dyslipidemia, diabetes mellitus, sedentary lifestyle, smoking/tobacco exposure and family history.  Hyperlipidemia  This is a chronic problem. The current episode started more than 1 year ago. The problem is uncontrolled (Triglycerides 744). Exacerbating diseases include diabetes and obesity. Pertinent negatives include no chest pain, myalgias or shortness of breath. She is currently on no antihyperlipidemic treatment. Risk factors for coronary artery disease include dyslipidemia, diabetes mellitus, hypertension, obesity and a sedentary lifestyle.     Review of Systems  Constitutional: Positive for fatigue. Negative for chills, fever and unexpected weight change.  HENT: Negative for trouble swallowing and voice change.   Eyes: Negative for visual disturbance.  Respiratory: Negative for cough, shortness of breath and wheezing.   Cardiovascular: Negative for chest pain, palpitations and leg swelling.  Gastrointestinal: Negative for diarrhea, nausea and vomiting.  Endocrine: Positive for polydipsia and polyuria. Negative for cold intolerance, heat intolerance and polyphagia.  Musculoskeletal: Negative for arthralgias and myalgias.  Skin: Negative for color change, pallor, rash and wound.  Neurological: Negative  for seizures and headaches.  Psychiatric/Behavioral: Negative for confusion and suicidal ideas.    Objective:    BP 133/86   Pulse (!) 118   Ht 5\' 6"  (1.676 m)   Wt 250 lb (113.4 kg)   BMI 40.35 kg/m   Wt Readings from Last 3 Encounters:  12/11/16 250 lb (113.4 kg)  12/03/16 247 lb (112 kg)  06/03/16 240 lb (108.9 kg)    Physical Exam  Constitutional: She is oriented to person, place, and time. She appears well-developed.  HENT:  Head: Normocephalic and atraumatic.  Eyes: EOM are normal.  Neck: Normal range of motion. Neck supple. No tracheal deviation present. No thyromegaly present.  Cardiovascular: Normal rate and regular rhythm.   Pulmonary/Chest: Effort normal and breath sounds normal.  Abdominal: Soft. Bowel sounds are normal. There is no tenderness. There is no guarding.  Musculoskeletal: Normal range of motion. She exhibits no edema.  Neurological: She is alert and oriented to person, place, and time. She has normal reflexes. No cranial nerve deficit. Coordination normal.  Skin: Skin is warm and dry. No rash noted. No erythema. No pallor.  Psychiatric: She has a normal mood and affect. Judgment normal.   Recent Results (from the past 2160 hour(s))  Lipid panel     Status: Abnormal   Collection Time: 11/26/16 12:00 AM  Result Value Ref Range  Triglycerides 744 (A) 40 - 160 mg/dL   Cholesterol 161 (A) 0 - 200 mg/dL   HDL 34 (A) 35 - 70 mg/dL  Hemoglobin W9U     Status: None   Collection Time: 11/26/16 12:00 AM  Result Value Ref Range   Hemoglobin A1C 9.3   TSH     Status: None   Collection Time: 11/26/16 12:00 AM  Result Value Ref Range   TSH 1.88 0.41 - 5.90 uIU/mL    Comment: free t4 1.09      Assessment & Plan:   1. Uncontrolled type 2 diabetes mellitus with complication, unspecified long term insulin use status (HCC)  - Patient has currently uncontrolled symptomatic type 2 DM since  45 years of age.  - Her a1c  has worsened to 9.3% from  7.9 % .  -   Recent labs reviewed, normal renal function. She has profound vitamin D deficiency and profound hypertriglyceridemia.   Her diabetes is complicated by peripheral neuropathy on Lyrica and patient remains at a high risk for more acute and chronic complications of diabetes which include CAD, CVA, CKD, retinopathy, and neuropathy. These are all discussed in detail with the patient.  - I have counseled the patient on diet management and weight loss, by adopting a carbohydrate restricted/protein rich diet.  - Suggestion is made for patient to avoid simple carbohydrates   from their diet including Cakes , Desserts, Ice Cream,  Soda (  diet and regular) , Sweet Tea , Candies,  Chips, Cookies, Artificial Sweeteners,   and "Sugar-free" Products . This will help patient to have stable blood glucose profile and potentially avoid unintended weight gain.  - I encouraged the patient to switch to  unprocessed or minimally processed complex starch and increased protein intake (animal or plant source), fruits, and vegetables.  - Patient is advised to stick to a routine mealtimes to eat 3 meals  a day and avoid unnecessary snacks ( to snack only to correct hypoglycemia).  - The patient will be scheduled with Norm Salt, RDN, CDE for individualized DM education.  - I have approached patient with the following individualized plan to manage diabetes and patient agrees:   - I urged her to stay engaged. - Based on  her glycemic burden she will continue to need basal/bolus insulin.  - I will increase Levemir to 80 units daily at bedtime , and increase NovoLog to 15 units 3 times a day before meals for re-meal BG readings of 90-150mg /dl, plus patient specific correction dose for unexpected hyperglycemia above 150mg /dl, associated with strict monitoring of glucose 4 times a day-before meals and at bedtime .  - Patient is warned not to take insulin without proper monitoring per orders. -Adjustment parameters are given  for hypo and hyperglycemia in writing. -Patient is encouraged to call clinic for blood glucose levels less than 70 or above 300 mg /dl. - She did not tolerate Invokana which she has stopped already, she is not a good candidate for Bydureon given her current triglyceridemia of 744 and recent history of pancreatitis.   I advised her to discontinue..  - Patient specific target  A1c;  LDL, HDL, Triglycerides, and  Waist Circumference were discussed in detail.  2) BP/HTN: Controlled. Continue current medications including ACEI/ARB. 3) Lipids/HPL:   uncontrolled, triglycerides 744,  I would initiate Lovaza 2 g by mouth twice a day, she will be considered for additional statin next visit.   4)  Weight/Diet: CDE Consult has been  initiated ,  exercise, and detailed carbohydrates information provided.  5) vitamin D deficiency: Vitamin D at 8 - I discussed and added prescriptions stings vitamin D 50,000 units weekly for 12 weeks.  6) Chronic Care/Health Maintenance:  -Patient is on ACEI/ARB  medications and encouraged to continue to follow up with Ophthalmology, Podiatrist at least yearly or according to recommendations, and advised to   stay away from smoking. I have recommended yearly flu vaccine and pneumonia vaccination at least every 5 years; moderate intensity exercise for up to 150 minutes weekly; and  sleep for at least 7 hours a day.  - 30 minutes of time was spent on the care of this patient , 50% of which was applied for counseling on diabetes complications and their preventions.  - Patient to bring meter and  blood glucose logs during their her visit.   - I advised patient to maintain close follow up with Kirstie Peri, MD for primary care needs.  Follow up plan: - Return in about 2 weeks (around 12/25/2016) for follow up with meter and logs- no labs.  Marquis Lunch, MD Phone: [phone]  Fax: [phone]   12/11/2016, 1:18 PM

## 2016-12-11 NOTE — Patient Instructions (Signed)

## 2016-12-25 ENCOUNTER — Ambulatory Visit (INDEPENDENT_AMBULATORY_CARE_PROVIDER_SITE_OTHER): Payer: Medicaid Other | Admitting: "Endocrinology

## 2016-12-25 ENCOUNTER — Encounter: Payer: Self-pay | Admitting: "Endocrinology

## 2016-12-25 VITALS — BP 135/86 | HR 102 | Ht 66.0 in | Wt 247.0 lb

## 2016-12-25 DIAGNOSIS — E6609 Other obesity due to excess calories: Secondary | ICD-10-CM | POA: Diagnosis not present

## 2016-12-25 DIAGNOSIS — E559 Vitamin D deficiency, unspecified: Secondary | ICD-10-CM

## 2016-12-25 DIAGNOSIS — IMO0002 Reserved for concepts with insufficient information to code with codable children: Secondary | ICD-10-CM

## 2016-12-25 DIAGNOSIS — Z794 Long term (current) use of insulin: Secondary | ICD-10-CM | POA: Diagnosis not present

## 2016-12-25 DIAGNOSIS — E118 Type 2 diabetes mellitus with unspecified complications: Secondary | ICD-10-CM

## 2016-12-25 DIAGNOSIS — E1165 Type 2 diabetes mellitus with hyperglycemia: Secondary | ICD-10-CM

## 2016-12-25 DIAGNOSIS — IMO0001 Reserved for inherently not codable concepts without codable children: Secondary | ICD-10-CM

## 2016-12-25 DIAGNOSIS — I10 Essential (primary) hypertension: Secondary | ICD-10-CM | POA: Diagnosis not present

## 2016-12-25 DIAGNOSIS — Z6841 Body Mass Index (BMI) 40.0 and over, adult: Secondary | ICD-10-CM

## 2016-12-25 NOTE — Patient Instructions (Signed)

## 2016-12-25 NOTE — Progress Notes (Signed)
Subjective:    Patient ID: Kara Hopkins, female    DOB: 07-Oct-1970. Patient is being seen in f/u for management of diabetes requested by  Kirstie Peri, MD  Past Medical History:  Diagnosis Date  . Diabetes mellitus without complication (HCC)   . Hyperlipidemia   . Hypertension    Past Surgical History:  Procedure Laterality Date  . CESAREAN SECTION    . HERNIA REPAIR    . TIBIA IM NAIL INSERTION Left 09/29/2015   Procedure: OPEN REDUCTION INTERNAL FIXATION (ORIF) LEFT TIBIA  SHAFT FRACTURE;  Surgeon: Sheral Apley, MD;  Location: [facility];  Service: Orthopedics;  Laterality: Left;   Social History   Social History  . Marital status: Married    Spouse name: N/A  . Number of children: N/A  . Years of education: N/A   Social History Main Topics  . Smoking status: Never Smoker  . Smokeless tobacco: Never Used  . Alcohol use No  . Drug use: No  . Sexual activity: Not Asked   Other Topics Concern  . None   Social History Narrative  . None   Outpatient Encounter Prescriptions as of 12/25/2016  Medication Sig  . Cholecalciferol (VITAMIN D3) 5000 units CAPS Take 1 capsule (5,000 Units total) by mouth daily.  Marland Kitchen dexamethasone (DECADRON) 4 MG tablet Take 1 tablet (4 mg total) by mouth 2 (two) times daily with a meal.  . esomeprazole (NEXIUM) 40 MG capsule Take 40 mg by mouth daily at 12 noon.  Marland Kitchen HYDROcodone-acetaminophen (NORCO/VICODIN) 5-325 MG tablet Take 1 tablet by mouth 3 (three) times daily as needed for moderate pain.  Marland Kitchen ibuprofen (ADVIL,MOTRIN) 800 MG tablet Take 800 mg by mouth every 8 (eight) hours as needed for mild pain or moderate pain.   Marland Kitchen insulin aspart (NOVOLOG FLEXPEN) 100 UNIT/ML FlexPen Inject 20-26 Units into the skin 3 (three) times daily with meals.  . insulin detemir (LEVEMIR) 100 UNIT/ML injection Inject 80 Units into the skin at bedtime.  Marland Kitchen lisinopril (PRINIVIL,ZESTRIL) 10 MG tablet Take 10 mg by mouth daily.  Marland Kitchen omega-3 acid  ethyl esters (LOVAZA) 1 g capsule Take 2 capsules (2 g total) by mouth 2 (two) times daily.  Marland Kitchen Phenylephrine-APAP-Guaifenesin (MUCINEX FAST-MAX) 10-650-400 MG/20ML LIQD Take 15 mLs by mouth daily as needed (for cough-congestion).  . pregabalin (LYRICA) 300 MG capsule Take 300 mg by mouth 2 (two) times daily.  . [DISCONTINUED] Insulin Detemir (LEVEMIR FLEXTOUCH) 100 UNIT/ML Pen Inject 80 Units into the skin daily at 10 pm.   No facility-administered encounter medications on file as of 12/25/2016.    ALLERGIES: Allergies  Allergen Reactions  . Parsley Seed Anaphylaxis  . Codeine     Swelling   . Sulfa Antibiotics     Rash   . Penicillins Rash   VACCINATION STATUS:  There is no immunization history on file for this patient.  Diabetes  She presents for her follow-up diabetic visit. She has type 2 diabetes mellitus. Onset time: She was diagnosed at approximate age of 32 years preceded by episodes of gestational diabetes on 2 separate pregnancies in her 40s. Her disease course has been improving. There are no hypoglycemic associated symptoms. Pertinent negatives for hypoglycemia include no confusion, headaches, pallor or seizures. Associated symptoms include fatigue, polydipsia and polyuria. Pertinent negatives for diabetes include no chest pain and no polyphagia. There are no hypoglycemic complications. Symptoms are improving. Diabetic complications include peripheral neuropathy. (She says she was hospitalized due to pancreatitis  last month.) Risk factors for coronary artery disease include dyslipidemia, diabetes mellitus, tobacco exposure, sedentary lifestyle, obesity and hypertension. Current diabetic treatments: She is on Levemir 80 units 3 times a day and NovoLog sliding scale, invokana 300 mg by mouth daily, and glipizide 5 mg twice a day. She is compliant with treatment none of the time. Her weight is stable. She is following a generally unhealthy diet. When asked about meal planning, she  reported none. She has not had a previous visit with a dietitian. She rarely participates in exercise. Her breakfast blood glucose range is generally >200 mg/dl. Her lunch blood glucose range is generally >200 mg/dl. Her dinner blood glucose range is generally >200 mg/dl. Her bedtime blood glucose range is generally >200 mg/dl. An ACE inhibitor/angiotensin II receptor blocker is not being taken. Eye exam is current.  Hypertension  This is a chronic problem. The current episode started more than 1 year ago. The problem is controlled. Pertinent negatives include no chest pain, headaches, palpitations or shortness of breath. Risk factors for coronary artery disease include obesity, dyslipidemia, diabetes mellitus, sedentary lifestyle, smoking/tobacco exposure and family history.  Hyperlipidemia  This is a chronic problem. The current episode started more than 1 year ago. The problem is uncontrolled (Triglycerides 744). Exacerbating diseases include diabetes and obesity. Pertinent negatives include no chest pain, myalgias or shortness of breath. She is currently on no antihyperlipidemic treatment. Risk factors for coronary artery disease include dyslipidemia, diabetes mellitus, hypertension, obesity and a sedentary lifestyle.     Review of Systems  Constitutional: Positive for fatigue. Negative for chills, fever and unexpected weight change.  HENT: Negative for trouble swallowing and voice change.   Eyes: Negative for visual disturbance.  Respiratory: Negative for cough, shortness of breath and wheezing.   Cardiovascular: Negative for chest pain, palpitations and leg swelling.  Gastrointestinal: Negative for diarrhea, nausea and vomiting.  Endocrine: Positive for polydipsia and polyuria. Negative for cold intolerance, heat intolerance and polyphagia.  Musculoskeletal: Negative for arthralgias and myalgias.  Skin: Negative for color change, pallor, rash and wound.  Neurological: Negative for seizures  and headaches.  Psychiatric/Behavioral: Negative for confusion and suicidal ideas.    Objective:    BP 135/86   Pulse (!) 102   Ht 5\' 6"  (1.676 m)   Wt 247 lb (112 kg)   BMI 39.87 kg/m   Wt Readings from Last 3 Encounters:  12/25/16 247 lb (112 kg)  12/11/16 250 lb (113.4 kg)  12/03/16 247 lb (112 kg)    Physical Exam  Constitutional: She is oriented to person, place, and time. She appears well-developed.  HENT:  Head: Normocephalic and atraumatic.  Eyes: EOM are normal.  Neck: Normal range of motion. Neck supple. No tracheal deviation present. No thyromegaly present.  Cardiovascular: Normal rate and regular rhythm.   Pulmonary/Chest: Effort normal and breath sounds normal.  Abdominal: Soft. Bowel sounds are normal. There is no tenderness. There is no guarding.  Musculoskeletal: Normal range of motion. She exhibits no edema.  Neurological: She is alert and oriented to person, place, and time. She has normal reflexes. No cranial nerve deficit. Coordination normal.  Skin: Skin is warm and dry. No rash noted. No erythema. No pallor.  Psychiatric: She has a normal mood and affect. Judgment normal.   Recent Results (from the past 2160 hour(s))  Lipid panel     Status: Abnormal   Collection Time: 11/26/16 12:00 AM  Result Value Ref Range   Triglycerides 744 (A) 40 - 160  mg/dL   Cholesterol 409203 (A) 0 - 200 mg/dL   HDL 34 (A) 35 - 70 mg/dL  Hemoglobin W1XA1c     Status: None   Collection Time: 11/26/16 12:00 AM  Result Value Ref Range   Hemoglobin A1C 9.3   TSH     Status: None   Collection Time: 11/26/16 12:00 AM  Result Value Ref Range   TSH 1.88 0.41 - 5.90 uIU/mL    Comment: free t4 1.09      Assessment & Plan:   1. Uncontrolled type 2 diabetes mellitus with complication, unspecified long term insulin use status (HCC)  - Patient has currently uncontrolled symptomatic type 2 DM since  46 years of age.  - Her a1c  has worsened to 9.3% from  7.9 % .  -  Recent labs  reviewed, normal renal function. She has profound vitamin D deficiency and profound hypertriglyceridemia.   Her diabetes is complicated by peripheral neuropathy on Lyrica and patient remains at a high risk for more acute and chronic complications of diabetes which include CAD, CVA, CKD, retinopathy, and neuropathy. These are all discussed in detail with the patient.  - I have counseled the patient on diet management and weight loss, by adopting a carbohydrate restricted/protein rich diet.  - Suggestion is made for patient to avoid simple carbohydrates   from her diet including Cakes , Desserts, Ice Cream,  Soda (  diet and regular) , Sweet Tea , Candies,  Chips, Cookies, Artificial Sweeteners,   and "Sugar-free" Products . This will help patient to have stable blood glucose profile and potentially avoid unintended weight gain.  - I encouraged the patient to switch to  unprocessed or minimally processed complex starch and increased protein intake (animal or plant source), fruits, and vegetables.  - Patient is advised to stick to a routine mealtimes to eat 3 meals  a day and avoid unnecessary snacks ( to snack only to correct hypoglycemia).   - I have approached patient with the following individualized plan to manage diabetes and patient agrees:   - I urged her to stay engaged. - Based on  her glycemic burden she will continue to need basal/bolus insulin.  - I will continue Levemir 80 units daily at bedtime , and increase NovoLog to 20 units 3 times a day before meals for re-meal BG readings of 90-150mg /dl, plus patient specific correction dose for unexpected hyperglycemia above 150mg /dl, associated with strict monitoring of glucose 4 times a day-before meals and at bedtime .  - Patient is warned not to take insulin without proper monitoring per orders. -Adjustment parameters are given for hypo and hyperglycemia in writing. -Patient is encouraged to call clinic for blood glucose levels less than  70 or above 300 mg /dl. - She did not tolerate Invokana which she has stopped already, she is not a good candidate for Bydureon given her current triglyceridemia of 744 and recent history of pancreatitis.   I advised her to discontinue..  - Patient specific target  A1c;  LDL, HDL, Triglycerides, and  Waist Circumference were discussed in detail.  2) BP/HTN: Controlled. Continue current medications including ACEI/ARB. 3) Lipids/HPL:   uncontrolled, triglycerides 744,  I would initiate Lovaza 2 g by mouth twice a day, she will be considered for additional statin next visit.   4)  Weight/Diet: I discussed bariatric surgery and gave her information brochure. CDE Consult has been  initiated , exercise, and detailed carbohydrates information provided.  5) vitamin D deficiency: Vitamin  D at 8 - I discussed and added prescriptions stings vitamin D 50,000 units weekly for 12 weeks.  6) Chronic Care/Health Maintenance:  -Patient is on ACEI/ARB  medications and encouraged to continue to follow up with Ophthalmology, Podiatrist at least yearly or according to recommendations, and advised to   stay away from smoking. I have recommended yearly flu vaccine and pneumonia vaccination at least every 5 years; moderate intensity exercise for up to 150 minutes weekly; and  sleep for at least 7 hours a day.  - 30 minutes of time was spent on the care of this patient , 50% of which was applied for counseling on diabetes complications and their preventions.  - Patient to bring meter and  blood glucose logs during her her visit.   - I advised patient to maintain close follow up with Kirstie Peri, MD for primary care needs.  Follow up plan: - Return in about 2 months (around 02/24/2017) for meter, and logs, follow up with pre-visit labs, meter, and logs.  Marquis Lunch, MD Phone: [phone]  Fax: [phone]   12/25/2016, 11:44 AM

## 2017-02-13 LAB — BASIC METABOLIC PANEL
BUN: 10 (ref 4–21)
Creatinine: 0.5 (ref <=1.1)

## 2017-02-13 LAB — HEMOGLOBIN A1C: Hemoglobin A1C: 9.2

## 2017-02-19 ENCOUNTER — Ambulatory Visit (INDEPENDENT_AMBULATORY_CARE_PROVIDER_SITE_OTHER): Payer: Medicaid Other | Admitting: "Endocrinology

## 2017-02-19 ENCOUNTER — Encounter: Payer: Self-pay | Admitting: "Endocrinology

## 2017-02-19 VITALS — BP 128/84 | HR 95 | Wt 253.0 lb

## 2017-02-19 DIAGNOSIS — E1165 Type 2 diabetes mellitus with hyperglycemia: Secondary | ICD-10-CM | POA: Diagnosis not present

## 2017-02-19 DIAGNOSIS — E6609 Other obesity due to excess calories: Secondary | ICD-10-CM

## 2017-02-19 DIAGNOSIS — Z794 Long term (current) use of insulin: Secondary | ICD-10-CM

## 2017-02-19 DIAGNOSIS — E118 Type 2 diabetes mellitus with unspecified complications: Secondary | ICD-10-CM | POA: Diagnosis not present

## 2017-02-19 DIAGNOSIS — Z6841 Body Mass Index (BMI) 40.0 and over, adult: Secondary | ICD-10-CM | POA: Diagnosis not present

## 2017-02-19 DIAGNOSIS — IMO0001 Reserved for inherently not codable concepts without codable children: Secondary | ICD-10-CM

## 2017-02-19 DIAGNOSIS — I10 Essential (primary) hypertension: Secondary | ICD-10-CM | POA: Diagnosis not present

## 2017-02-19 DIAGNOSIS — E782 Mixed hyperlipidemia: Secondary | ICD-10-CM | POA: Diagnosis not present

## 2017-02-19 DIAGNOSIS — IMO0002 Reserved for concepts with insufficient information to code with codable children: Secondary | ICD-10-CM

## 2017-02-19 MED ORDER — INSULIN DETEMIR 100 UNIT/ML FLEXPEN: 90.0000 [IU] | PEN_INJECTOR | Freq: Every day | SUBCUTANEOUS | 2 refills | Status: DC

## 2017-02-19 MED ORDER — INSULIN ASPART 100 UNIT/ML FLEXPEN: 25.0000 [IU] | PEN_INJECTOR | Freq: Three times a day (TID) | SUBCUTANEOUS | 2 refills | Status: DC

## 2017-02-19 NOTE — Progress Notes (Signed)
Subjective:    Patient ID: Kara Hopkins, female    DOB: 1970-10-07. Patient is being seen in f/u for management of diabetes requested by  Kirstie Peri, MD  Past Medical History:  Diagnosis Date  . Diabetes mellitus without complication (HCC)   . Hyperlipidemia   . Hypertension    Past Surgical History:  Procedure Laterality Date  . CESAREAN SECTION    . HERNIA REPAIR    . TIBIA IM NAIL INSERTION Left 09/29/2015   Procedure: OPEN REDUCTION INTERNAL FIXATION (ORIF) LEFT TIBIA  SHAFT FRACTURE;  Surgeon: Sheral Apley, MD;  Location: [facility];  Service: Orthopedics;  Laterality: Left;   Social History   Social History  . Marital status: Married    Spouse name: N/A  . Number of children: N/A  . Years of education: N/A   Social History Main Topics  . Smoking status: Never Smoker  . Smokeless tobacco: Never Used  . Alcohol use No  . Drug use: No  . Sexual activity: Not Asked   Other Topics Concern  . None   Social History Narrative  . None   Outpatient Encounter Prescriptions as of 02/19/2017  Medication Sig  . Cholecalciferol (VITAMIN D3) 5000 units CAPS Take 1 capsule (5,000 Units total) by mouth daily.  Marland Kitchen dexamethasone (DECADRON) 4 MG tablet Take 1 tablet (4 mg total) by mouth 2 (two) times daily with a meal.  . esomeprazole (NEXIUM) 40 MG capsule Take 40 mg by mouth daily at 12 noon.  Marland Kitchen HYDROcodone-acetaminophen (NORCO/VICODIN) 5-325 MG tablet Take 1 tablet by mouth 3 (three) times daily as needed for moderate pain.  Marland Kitchen ibuprofen (ADVIL,MOTRIN) 800 MG tablet Take 800 mg by mouth every 8 (eight) hours as needed for mild pain or moderate pain.   Marland Kitchen insulin aspart (NOVOLOG FLEXPEN) 100 UNIT/ML FlexPen Inject 25-31 Units into the skin 3 (three) times daily with meals.  . Insulin Detemir (LEVEMIR FLEXTOUCH) 100 UNIT/ML Pen Inject 90 Units into the skin at bedtime.  Marland Kitchen lisinopril (PRINIVIL,ZESTRIL) 10 MG tablet Take 10 mg by mouth daily.  Marland Kitchen omega-3 acid  ethyl esters (LOVAZA) 1 g capsule Take 2 capsules (2 g total) by mouth 2 (two) times daily.  Marland Kitchen Phenylephrine-APAP-Guaifenesin (MUCINEX FAST-MAX) 10-650-400 MG/20ML LIQD Take 15 mLs by mouth daily as needed (for cough-congestion).  . pregabalin (LYRICA) 300 MG capsule Take 300 mg by mouth 2 (two) times daily.  . [DISCONTINUED] insulin aspart (NOVOLOG FLEXPEN) 100 UNIT/ML FlexPen Inject 25-31 Units into the skin 3 (three) times daily with meals.  . [DISCONTINUED] Insulin Detemir (LEVEMIR FLEXTOUCH [place]) Inject 90 Units into the skin at bedtime.  . [DISCONTINUED] insulin detemir (LEVEMIR) 100 UNIT/ML injection Inject 90 Units into the skin at bedtime.   No facility-administered encounter medications on file as of 02/19/2017.    ALLERGIES: Allergies  Allergen Reactions  . Parsley Seed Anaphylaxis  . Codeine     Swelling   . Sulfa Antibiotics     Rash   . Penicillins Rash   VACCINATION STATUS:  There is no immunization history on file for this patient.  Diabetes  She presents for her follow-up diabetic visit. She has type 2 diabetes mellitus. Onset time: She was diagnosed at approximate age of 32 years preceded by episodes of gestational diabetes on 2 separate pregnancies in her 44s. Her disease course has been worsening. There are no hypoglycemic associated symptoms. Pertinent negatives for hypoglycemia include no confusion, headaches, pallor or seizures. Associated symptoms include  fatigue, polydipsia and polyuria. Pertinent negatives for diabetes include no chest pain and no polyphagia. There are no hypoglycemic complications. Symptoms are worsening. Diabetic complications include peripheral neuropathy. (She says she was hospitalized due to pancreatitis last month.) Risk factors for coronary artery disease include dyslipidemia, diabetes mellitus, tobacco exposure, sedentary lifestyle, obesity and hypertension. Current diabetic treatments: She is on Levemir 80 units 3 times a day and NovoLog sliding  scale, invokana 300 mg by mouth daily, and glipizide 5 mg twice a day. She is compliant with treatment none of the time. Her weight is increasing steadily. She is following a generally unhealthy diet. When asked about meal planning, she reported none. She has not had a previous visit with a dietitian. She rarely participates in exercise. Her breakfast blood glucose range is generally >200 mg/dl. Her lunch blood glucose range is generally >200 mg/dl. Her dinner blood glucose range is generally >200 mg/dl. Her bedtime blood glucose range is generally >200 mg/dl. An ACE inhibitor/angiotensin II receptor blocker is not being taken. Eye exam is current.  Hypertension  This is a chronic problem. The current episode started more than 1 year ago. The problem is controlled. Pertinent negatives include no chest pain, headaches, palpitations or shortness of breath. Risk factors for coronary artery disease include obesity, dyslipidemia, diabetes mellitus, sedentary lifestyle, smoking/tobacco exposure and family history.  Hyperlipidemia  This is a chronic problem. The current episode started more than 1 year ago. The problem is uncontrolled (Triglycerides 744). Exacerbating diseases include diabetes and obesity. Pertinent negatives include no chest pain, myalgias or shortness of breath. She is currently on no antihyperlipidemic treatment. Risk factors for coronary artery disease include dyslipidemia, diabetes mellitus, hypertension, obesity and a sedentary lifestyle.     Review of Systems  Constitutional: Positive for fatigue. Negative for chills, fever and unexpected weight change.  HENT: Negative for trouble swallowing and voice change.   Eyes: Negative for visual disturbance.  Respiratory: Negative for cough, shortness of breath and wheezing.   Cardiovascular: Negative for chest pain, palpitations and leg swelling.  Gastrointestinal: Negative for diarrhea, nausea and vomiting.  Endocrine: Positive for  polydipsia and polyuria. Negative for cold intolerance, heat intolerance and polyphagia.  Musculoskeletal: Negative for arthralgias and myalgias.  Skin: Negative for color change, pallor, rash and wound.  Neurological: Negative for seizures and headaches.  Psychiatric/Behavioral: Negative for confusion and suicidal ideas.    Objective:    BP 128/84   Pulse 95   Wt 253 lb (114.8 kg)   SpO2 95%   BMI 40.84 kg/m   Wt Readings from Last 3 Encounters:  02/19/17 253 lb (114.8 kg)  12/25/16 247 lb (112 kg)  12/11/16 250 lb (113.4 kg)    Physical Exam  Constitutional: She is oriented to person, place, and time. She appears well-developed.  HENT:  Head: Normocephalic and atraumatic.  Eyes: EOM are normal.  Neck: Normal range of motion. Neck supple. No tracheal deviation present. No thyromegaly present.  Cardiovascular: Normal rate and regular rhythm.   Pulmonary/Chest: Effort normal and breath sounds normal.  Abdominal: Soft. Bowel sounds are normal. There is no tenderness. There is no guarding.  Musculoskeletal: Normal range of motion. She exhibits no edema.  Neurological: She is alert and oriented to person, place, and time. She has normal reflexes. No cranial nerve deficit. Coordination normal.  Skin: Skin is warm and dry. No rash noted. No erythema. No pallor.  Psychiatric: She has a normal mood and affect. Judgment normal.   Recent Results (from  the past 2160 hour(s))  Lipid panel     Status: Abnormal   Collection Time: 11/26/16 12:00 AM  Result Value Ref Range   Triglycerides 744 (A) 40 - 160 mg/dL   Cholesterol 956 (A) 0 - 200 mg/dL   HDL 34 (A) 35 - 70 mg/dL  Hemoglobin O1H     Status: None   Collection Time: 11/26/16 12:00 AM  Result Value Ref Range   Hemoglobin A1C 9.3   TSH     Status: None   Collection Time: 11/26/16 12:00 AM  Result Value Ref Range   TSH 1.88 0.41 - 5.90 uIU/mL    Comment: free t4 1.09  Basic metabolic panel     Status: None   Collection Time:  02/13/17 12:00 AM  Result Value Ref Range   BUN 10 4 - 21   Creatinine 0.5 0.5 - 1.1  Hemoglobin A1c     Status: None   Collection Time: 02/13/17 12:00 AM  Result Value Ref Range   Hemoglobin A1C 9.2       Assessment & Plan:   1. Uncontrolled type 2 diabetes mellitus with complication, unspecified long term insulin use status (HCC)  - Patient has currently uncontrolled symptomatic type 2 DM since  46 years of age.  -  Recent labs reviewed, normal renal function. She recently had profound vitamin D deficiency and profound hypertriglyceridemia.   Her diabetes is complicated by peripheral neuropathy on Lyrica and patient remains at a high risk for more acute and chronic complications of diabetes which include CAD, CVA, CKD, retinopathy, and neuropathy. These are all discussed in detail with the patient.  - I have counseled the patient on diet management and weight loss, by adopting a carbohydrate restricted/protein rich diet.  - Suggestion is made for patient to avoid simple carbohydrates   from her diet including Cakes , Desserts, Ice Cream,  Soda (  diet and regular) , Sweet Tea , Candies,  Chips, Cookies, Artificial Sweeteners,   and "Sugar-free" Products . This will help patient to have stable blood glucose profile and potentially avoid unintended weight gain.  - I encouraged the patient to switch to  unprocessed or minimally processed complex starch and increased protein intake (animal or plant source), fruits, and vegetables.  - Patient is advised to stick to a routine mealtimes to eat 3 meals  a day and avoid unnecessary snacks ( to snack only to correct hypoglycemia).   - I have approached patient with the following individualized plan to manage diabetes and patient agrees:   - I urged her to stay engaged. - Based on  her glycemic burden she will continue to need basal/bolus insulin.  - I will increase Levemir to 90 units daily at bedtime , and increase NovoLog to 25 units 3  times a day before meals for re-meal BG readings of 90-150mg /dl, plus patient specific correction dose for unexpected hyperglycemia above 150mg /dl, associated with strict monitoring of glucose 4 times a day-before meals and at bedtime .  - Patient is warned not to take insulin without proper monitoring per orders. -Adjustment parameters are given for hypo and hyperglycemia in writing. -Patient is encouraged to call clinic for blood glucose levels less than 70 or above 300 mg /dl. - She did not tolerate Invokana which she has stopped already, she is not a good candidate for Bydureon given her current triglyceridemia of 744 and recent history of pancreatitis.   I advised her to discontinue.  - Patient specific target  A1c;  LDL, HDL, Triglycerides, and  Waist Circumference were discussed in detail.  2) BP/HTN: Controlled. Continue current medications including ACEI/ARB. 3) Lipids/HPL:   uncontrolled, triglycerides 744,  I advised her to continue Lovaza 2 g by mouth twice a day, she will be considered for additional statin next visit.   4)  Weight/Diet: I discussed bariatric surgery and gave her information brochure. CDE Consult has been  initiated , exercise, and detailed carbohydrates information provided.  5) vitamin D deficiency: Vitamin D at 8 - She is status post therapy with vitamin D 50,000 units weekly for weeks.  6) Chronic Care/Health Maintenance:  -Patient is on ACEI/ARB  medications and encouraged to continue to follow up with Ophthalmology, Podiatrist at least yearly or according to recommendations, and advised to   stay away from smoking. I have recommended yearly flu vaccine and pneumonia vaccination at least every 5 years; moderate intensity exercise for up to 150 minutes weekly; and  sleep for at least 7 hours a day.  - 20 minutes of time was spent on the care of this patient , 50% of which was applied for counseling on diabetes complications and their preventions.  - Patient to  bring meter and  blood glucose logs during her her visit.   - I advised patient to maintain close follow up with Kirstie PeriShah, Ashish, MD for primary care needs.  Follow up plan: - Return in about 3 months (around 05/22/2017) for follow up with pre-visit labs, meter, and logs.  Marquis LunchGebre Mychaela Lennartz, MD Phone: [phone]  Fax: [phone]   02/19/2017, 11:25 AM

## 2017-02-19 NOTE — Patient Instructions (Signed)

## 2017-03-14 IMAGING — CR DG CHEST 1V PORT
2 series · 2 of 2 positions shown · non-contrast
Comparison: None.

CLINICAL DATA: Preop tibia fracture.

EXAM:
PORTABLE CHEST 1 VIEW

[AP (1 of 2)]
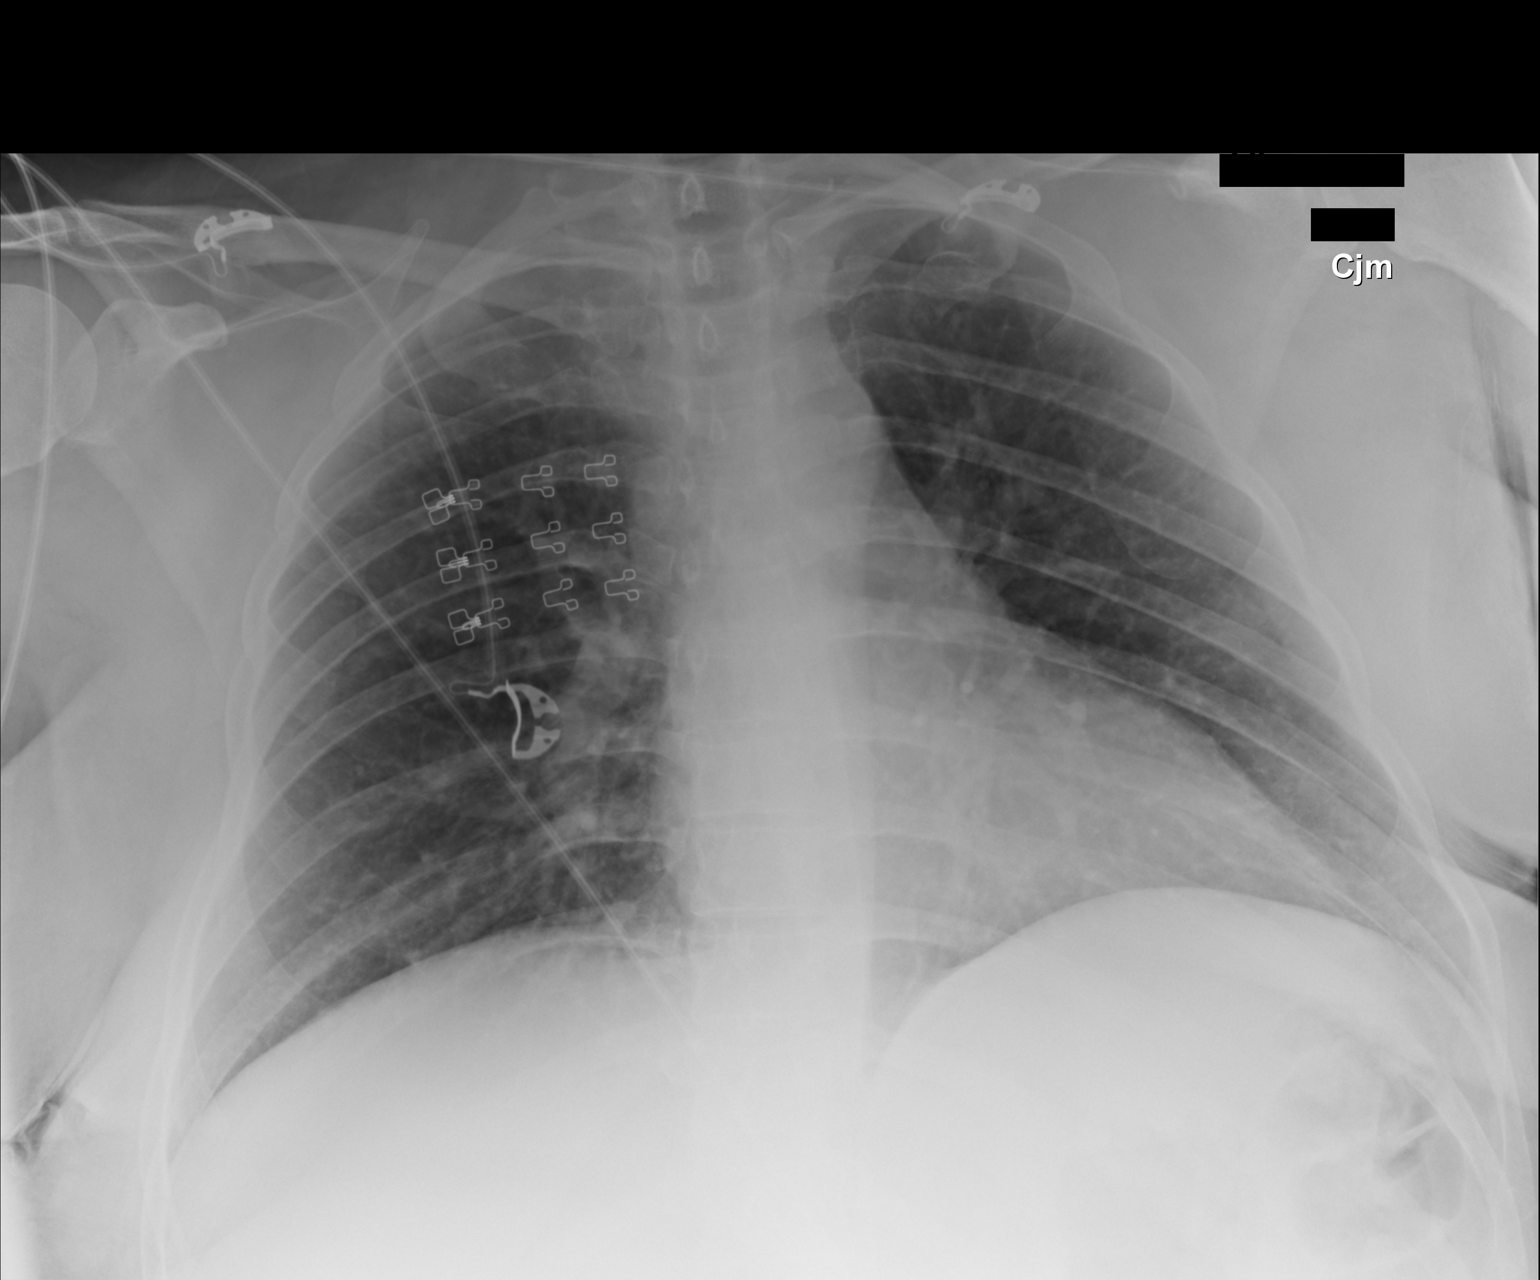

[AP (2 of 2)]
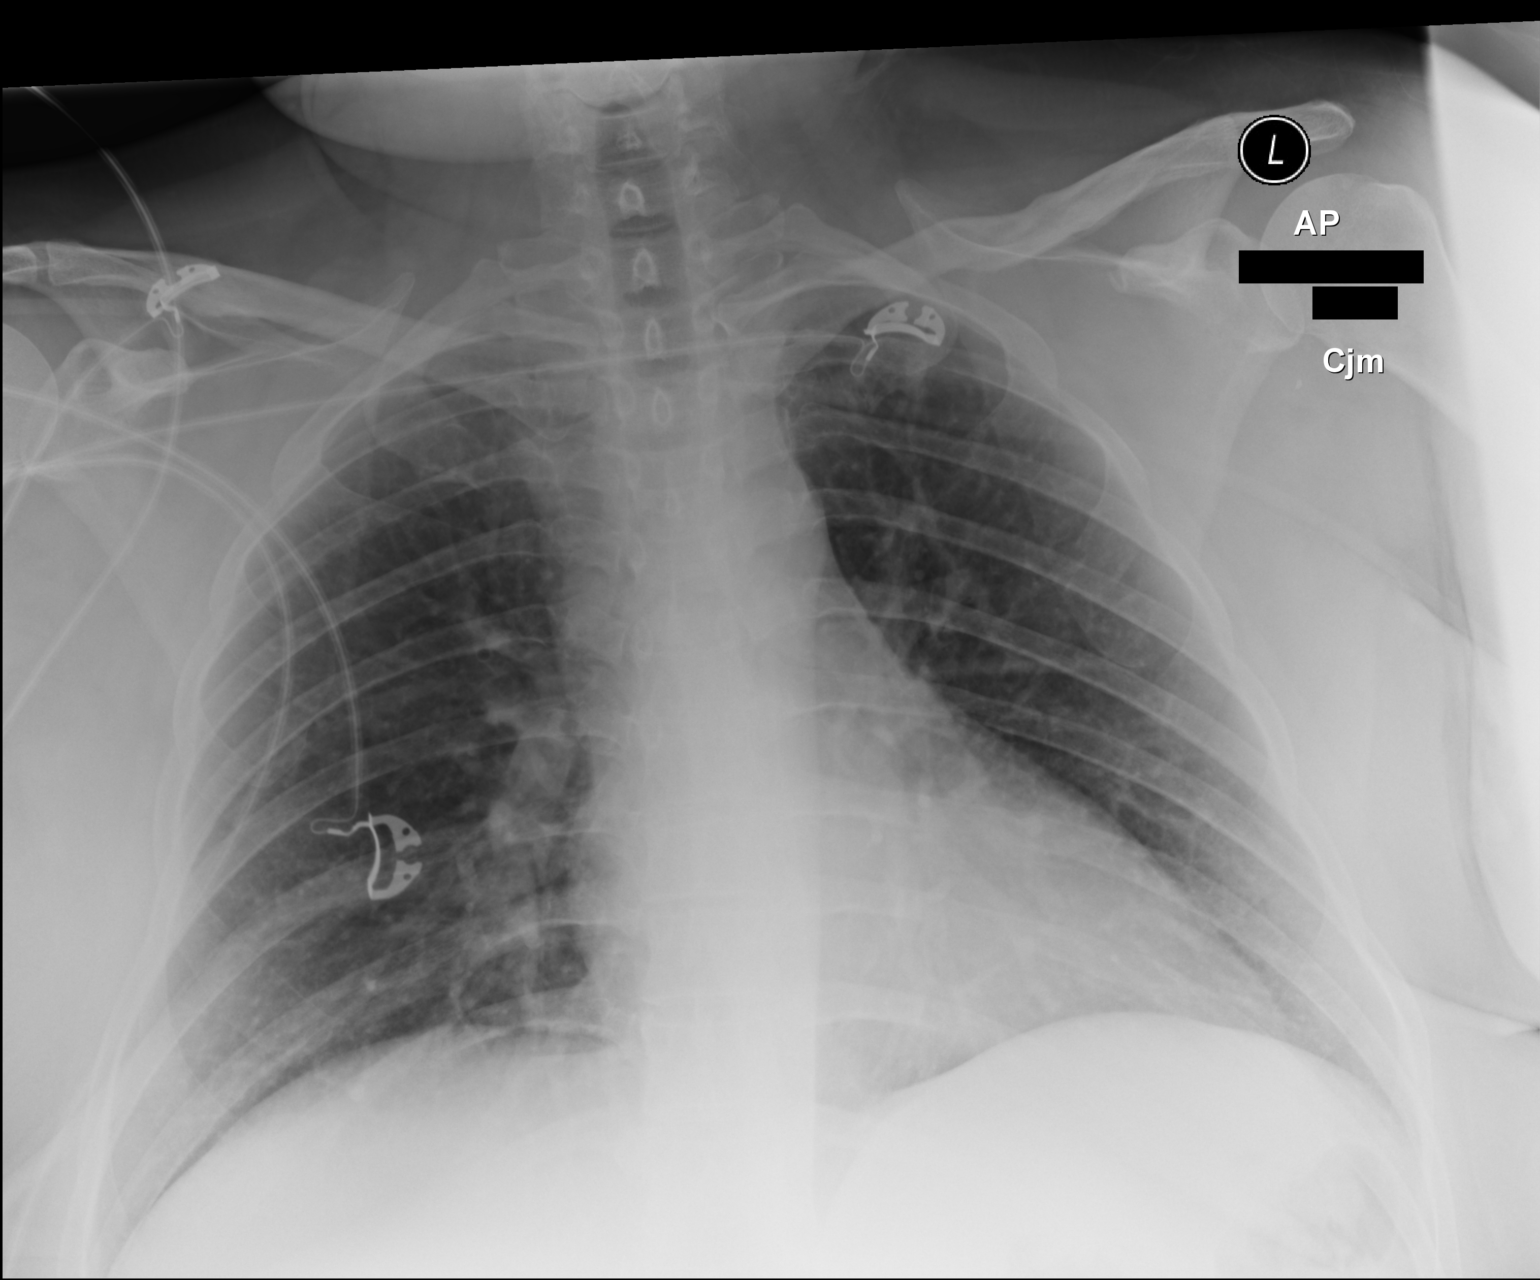

[2 of 2 positions shown; findings below may reference images not displayed]

FINDINGS: Lungs are adequately inflated as lordotic technique is demonstrated.
There is no consolidation or effusion. Cardiomediastinal silhouette,
bones and soft tissues are within normal.
IMPRESSION: No active disease.
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
## 2017-04-17 ENCOUNTER — Other Ambulatory Visit: Payer: Self-pay | Admitting: "Endocrinology

## 2017-05-18 ENCOUNTER — Other Ambulatory Visit: Payer: Self-pay | Admitting: "Endocrinology

## 2017-05-19 ENCOUNTER — Other Ambulatory Visit: Payer: Self-pay | Admitting: "Endocrinology

## 2017-05-19 DIAGNOSIS — E1165 Type 2 diabetes mellitus with hyperglycemia: Secondary | ICD-10-CM

## 2017-05-20 LAB — HEMOGLOBIN A1C: Hemoglobin A1C: 9.5

## 2017-05-20 LAB — BASIC METABOLIC PANEL
BUN: 10 (ref 4–21)
Creatinine: 0.5 (ref 0.5–1.1)

## 2017-05-27 ENCOUNTER — Encounter: Payer: Self-pay | Admitting: "Endocrinology

## 2017-05-27 ENCOUNTER — Ambulatory Visit (INDEPENDENT_AMBULATORY_CARE_PROVIDER_SITE_OTHER): Payer: Medicaid Other | Admitting: "Endocrinology

## 2017-05-27 VITALS — BP 146/98 | HR 97 | Ht 66.0 in | Wt 265.0 lb

## 2017-05-27 DIAGNOSIS — Z91199 Patient's noncompliance with other medical treatment and regimen due to unspecified reason: Secondary | ICD-10-CM

## 2017-05-27 DIAGNOSIS — I10 Essential (primary) hypertension: Secondary | ICD-10-CM | POA: Diagnosis not present

## 2017-05-27 DIAGNOSIS — E118 Type 2 diabetes mellitus with unspecified complications: Secondary | ICD-10-CM | POA: Diagnosis not present

## 2017-05-27 DIAGNOSIS — E1165 Type 2 diabetes mellitus with hyperglycemia: Secondary | ICD-10-CM

## 2017-05-27 DIAGNOSIS — Z9119 Patient's noncompliance with other medical treatment and regimen: Secondary | ICD-10-CM

## 2017-05-27 DIAGNOSIS — E782 Mixed hyperlipidemia: Secondary | ICD-10-CM | POA: Diagnosis not present

## 2017-05-27 DIAGNOSIS — IMO0002 Reserved for concepts with insufficient information to code with codable children: Secondary | ICD-10-CM | POA: Insufficient documentation

## 2017-05-27 DIAGNOSIS — E559 Vitamin D deficiency, unspecified: Secondary | ICD-10-CM | POA: Diagnosis not present

## 2017-05-27 DIAGNOSIS — Z794 Long term (current) use of insulin: Secondary | ICD-10-CM | POA: Diagnosis not present

## 2017-05-27 MED ORDER — INSULIN DETEMIR 100 UNIT/ML FLEXPEN: 90.0000 [IU] | PEN_INJECTOR | Freq: Every day | SUBCUTANEOUS | 2 refills | Status: DC

## 2017-05-27 MED ORDER — INSULIN ASPART 100 UNIT/ML FLEXPEN: PEN_INJECTOR | SUBCUTANEOUS | 2 refills | Status: DC

## 2017-05-27 NOTE — Patient Instructions (Signed)

## 2017-05-27 NOTE — Progress Notes (Signed)
Subjective:    Patient ID: Kara Hopkins, female    DOB: 12/31/1970. Patient is being seen in f/u for management of diabetes requested by  Kirstie PeriShah, Ashish, MD  Past Medical History:  Diagnosis Date  . Diabetes mellitus without complication (HCC)   . Hyperlipidemia   . Hypertension    Past Surgical History:  Procedure Laterality Date  . CESAREAN SECTION    . HERNIA REPAIR     Social History   Socioeconomic History  . Marital status: Married    Spouse name: None  . Number of children: None  . Years of education: None  . Highest education level: None  Social Needs  . Financial resource strain: None  . Food insecurity - worry: None  . Food insecurity - inability: None  . Transportation needs - medical: None  . Transportation needs - non-medical: None  Occupational History  . None  Tobacco Use  . Smoking status: Never Smoker  . Smokeless tobacco: Never Used  Substance and Sexual Activity  . Alcohol use: No  . Drug use: No  . Sexual activity: None  Other Topics Concern  . None  Social History Narrative  . None   Outpatient Encounter Medications as of 05/27/2017  Medication Sig  . Cholecalciferol (VITAMIN D3) 5000 units CAPS Take 1 capsule (5,000 Units total) by mouth daily.  Marland Kitchen. dexamethasone (DECADRON) 4 MG tablet Take 1 tablet (4 mg total) by mouth 2 (two) times daily with a meal.  . esomeprazole (NEXIUM) 40 MG capsule Take 40 mg by mouth daily at 12 noon.  Marland Kitchen. HYDROcodone-acetaminophen (NORCO/VICODIN) 5-325 MG tablet Take 1 tablet by mouth 3 (three) times daily as needed for moderate pain.  Marland Kitchen. ibuprofen (ADVIL,MOTRIN) 800 MG tablet Take 800 mg by mouth every 8 (eight) hours as needed for mild pain or moderate pain.   Marland Kitchen. insulin aspart (NOVOLOG FLEXPEN) 100 UNIT/ML FlexPen INJECT 25-31 UNITS INTO THE SKIN THREE TIMES DAILY WITH MEALS  . Insulin Detemir (LEVEMIR FLEXTOUCH) 100 UNIT/ML Pen Inject 90 Units at bedtime into the skin.  Marland Kitchen. lisinopril (PRINIVIL,ZESTRIL) 10 MG  tablet Take 10 mg by mouth daily.  Marland Kitchen. omega-3 acid ethyl esters (LOVAZA) 1 g capsule TAKE TWO CAPSULES BY MOUTH TWICE DAILY  . Phenylephrine-APAP-Guaifenesin (MUCINEX FAST-MAX) 10-650-400 MG/20ML LIQD Take 15 mLs by mouth daily as needed (for cough-congestion).  . pregabalin (LYRICA) 300 MG capsule Take 300 mg by mouth 2 (two) times daily.  . [DISCONTINUED] LEVEMIR FLEXTOUCH 100 UNIT/ML Pen INJECT 90 UNITS INTO THE SKIN AT BEDTIME  . [DISCONTINUED] NOVOLOG FLEXPEN 100 UNIT/ML FlexPen INJECT 25-31 UNITS INTO THE SKIN THREE TIMES DAILY WITH MEALS   No facility-administered encounter medications on file as of 05/27/2017.    ALLERGIES: Allergies  Allergen Reactions  . Parsley Seed Anaphylaxis  . Codeine     Swelling   . Sulfa Antibiotics     Rash   . Penicillins Rash   VACCINATION STATUS:  There is no immunization history on file for this patient.  Diabetes  She presents for her follow-up diabetic visit. She has type 2 diabetes mellitus. Onset time: She was diagnosed at approximate age of 32 years preceded by episodes of gestational diabetes on 2 separate pregnancies in her 8620s. Her disease course has been worsening. There are no hypoglycemic associated symptoms. Pertinent negatives for hypoglycemia include no confusion, headaches, pallor or seizures. Associated symptoms include fatigue, polydipsia and polyuria. Pertinent negatives for diabetes include no chest pain and no polyphagia. There are  no hypoglycemic complications. Symptoms are worsening. Diabetic complications include peripheral neuropathy. (She says she was hospitalized due to pancreatitis last month.) Risk factors for coronary artery disease include dyslipidemia, diabetes mellitus, tobacco exposure, sedentary lifestyle, obesity and hypertension. Current diabetic treatments: She is on Levemir 80 units 3 times a day and NovoLog sliding scale, invokana 300 mg by mouth daily, and glipizide 5 mg twice a day. She is compliant with treatment  none of the time. Her weight is increasing rapidly. She is following a generally unhealthy diet. When asked about meal planning, she reported none. She has not had a previous visit with a dietitian. She rarely participates in exercise. Her breakfast blood glucose range is generally >200 mg/dl. Her lunch blood glucose range is generally >200 mg/dl. Her dinner blood glucose range is generally >200 mg/dl. Her bedtime blood glucose range is generally >200 mg/dl. Her overall blood glucose range is >200 mg/dl. (Shortly brought a week's worth of glucose profile. Her A1c is still high at 9.5%. She did not document her insulin administration records.) An ACE inhibitor/angiotensin II receptor blocker is not being taken. Eye exam is current.  Hypertension  This is a chronic problem. The current episode started more than 1 year ago. The problem is controlled. Pertinent negatives include no chest pain, headaches, palpitations or shortness of breath. Risk factors for coronary artery disease include obesity, dyslipidemia, diabetes mellitus, sedentary lifestyle, smoking/tobacco exposure and family history.  Hyperlipidemia  This is a chronic problem. The current episode started more than 1 year ago. The problem is uncontrolled (Triglycerides 744). Exacerbating diseases include diabetes and obesity. Pertinent negatives include no chest pain, myalgias or shortness of breath. She is currently on no antihyperlipidemic treatment. Risk factors for coronary artery disease include dyslipidemia, diabetes mellitus, hypertension, obesity and a sedentary lifestyle.      Review of Systems  Constitutional: Positive for fatigue. Negative for chills, fever and unexpected weight change.  HENT: Negative for trouble swallowing and voice change.   Eyes: Negative for visual disturbance.  Respiratory: Negative for cough, shortness of breath and wheezing.   Cardiovascular: Negative for chest pain, palpitations and leg swelling.   Gastrointestinal: Negative for diarrhea, nausea and vomiting.  Endocrine: Positive for polydipsia and polyuria. Negative for cold intolerance, heat intolerance and polyphagia.  Musculoskeletal: Negative for arthralgias and myalgias.  Skin: Negative for color change, pallor, rash and wound.  Neurological: Negative for seizures and headaches.  Psychiatric/Behavioral: Negative for confusion and suicidal ideas.    Objective:    BP (!) 146/98   Pulse 97   Ht 5\' 6"  (1.676 m)   Wt 265 lb (120.2 kg)   BMI 42.77 kg/m   Wt Readings from Last 3 Encounters:  05/27/17 265 lb (120.2 kg)  02/19/17 253 lb (114.8 kg)  12/25/16 247 lb (112 kg)    Physical Exam  Constitutional: She is oriented to person, place, and time. She appears well-developed.  HENT:  Head: Normocephalic and atraumatic.  Eyes: EOM are normal.  Neck: Normal range of motion. Neck supple. No tracheal deviation present. No thyromegaly present.  Cardiovascular: Normal rate and regular rhythm.  Pulmonary/Chest: Effort normal and breath sounds normal.  Abdominal: Soft. Bowel sounds are normal. There is no tenderness. There is no guarding.  Musculoskeletal: Normal range of motion. She exhibits no edema.  Neurological: She is alert and oriented to person, place, and time. She has normal reflexes. No cranial nerve deficit. Coordination normal.  Skin: Skin is warm and dry. No rash noted. No erythema.  No pallor.  Psychiatric: She has a normal mood and affect. Judgment normal.   Recent Results (from the past 2160 hour(s))  Basic metabolic panel     Status: None   Collection Time: 05/20/17 12:00 AM  Result Value Ref Range   BUN 10 4 - 21   Creatinine 0.5 0.5 - 1.1  Hemoglobin A1c     Status: None   Collection Time: 05/20/17 12:00 AM  Result Value Ref Range   Hemoglobin A1C 9.5       Assessment & Plan:   1. Uncontrolled type 2 diabetes mellitus with complication, unspecified long term insulin use status (HCC)  - Patient has  currently uncontrolled symptomatic type 2 DM since  46 years of age.  -  Recent labs reviewed, normal renal function, A1c remains high at 9.5%. She recently had profound vitamin D deficiency and profound hypertriglyceridemia.   Her diabetes is complicated by peripheral neuropathy on Lyrica and patient remains at a high risk for more acute and chronic complications of diabetes which include CAD, CVA, CKD, retinopathy, and neuropathy. These are all discussed in detail with the patient.  - I have counseled the patient on diet management and weight loss, by adopting a carbohydrate restricted/protein rich diet.  -  Suggestion is made for her to avoid simple carbohydrates  from her diet including Cakes, Sweet Desserts / Pastries, Ice Cream, Soda (diet and regular), Sweet Tea, Candies, Chips, Cookies, Store Bought Juices, Alcohol in Excess of  1-2 drinks a day, Artificial Sweeteners, and "Sugar-free" Products. This will help patient to have stable blood glucose profile and potentially avoid unintended weight gain.   - I encouraged the patient to switch to  unprocessed or minimally processed complex starch and increased protein intake (animal or plant source), fruits, and vegetables.  - Patient is advised to stick to a routine mealtimes to eat 3 meals  a day and avoid unnecessary snacks ( to snack only to correct hypoglycemia).   - I have approached patient with the following individualized plan to manage diabetes and patient agrees:   - To make matters worse, she came with no meter and only a week's worth of blood glucose readings which are mostly suspiciously multiples of 10.  I urged her to engage better. - She may require much higher dose of insulin, however, her commitment for proper monitoring of blood glucose for safe use of insulin has to be assured first. - I advised her to continue Levemir 90 units daily at bedtime , and  NovoLog  25 units 3 times a day before meals for pre-meal blood glucose  readings of 90-150mg /dl, plus patient specific correction dose for unexpected hyperglycemia above 150mg /dl, associated with strict monitoring of glucose 4 times a day-before meals and at bedtime .  - Patient is warned not to take insulin without proper monitoring per orders. -Adjustment parameters are given for hypo and hyperglycemia in writing. -Patient is encouraged to call clinic for blood glucose levels less than 70 or above 300 mg /dl. - She did not tolerate Invokana , nor metformin. -  she is not a good candidate for Bydureon given her current triglyceridemia of 744 and recent history of pancreatitis.  - Patient specific target  A1c;  LDL, HDL, Triglycerides, and  Waist Circumference were discussed in detail.  2) BP/HTN: Controlled. Continue current medications including ACEI/ARB. 3) Lipids/HPL:   uncontrolled, triglycerides 744,  I advised her to continue Lovaza 2 g by mouth twice a day, she will be  considered for additional statin next visit.   4)  Weight/Diet: I discussed bariatric surgery and gave her information brochure, she is working with [facility] team.  CDE Consult has been  initiated , exercise, and detailed carbohydrates information provided.  5) vitamin D deficiency: - She is advised to continue vitamin D 5,000 units daily. 6) Chronic Care/Health Maintenance:  -Patient is on ACEI/ARB  medications and encouraged to continue to follow up with Ophthalmology, Podiatrist at least yearly or according to recommendations, and advised to   stay away from smoking. I have recommended yearly flu vaccine and pneumonia vaccination at least every 5 years; moderate intensity exercise for up to 150 minutes weekly; and  sleep for at least 7 hours a day.  - I advised patient to maintain close follow up with Kirstie PeriShah, Ashish, MD for primary care needs.  - Time spent with the patient: 25 min, of which >50% was spent in reviewing her sugar logs , discussing her  hyper-glycemic  episodes, reviewing her current and  previous labs and insulin doses and developing a plan to avoid hypo- and hyper-glycemia.   Follow up plan: - Return in about 3 months (around 08/27/2017) for follow up with pre-visit labs, meter, and logs.  Marquis LunchGebre Nida, MD Phone: [phone]  Fax: [phone]  -  This note was partially dictated with voice recognition software. Similar sounding words can be transcribed inadequately or may not  be corrected upon review.  05/27/2017, 11:33 AM

## 2017-06-11 ENCOUNTER — Other Ambulatory Visit: Payer: Self-pay | Admitting: "Endocrinology

## 2017-07-17 ENCOUNTER — Other Ambulatory Visit: Payer: Self-pay | Admitting: "Endocrinology

## 2017-08-27 LAB — BASIC METABOLIC PANEL
BUN: 16 (ref 4–21)
Creatinine: 0.5 (ref 0.5–1.1)

## 2017-08-27 LAB — HEMOGLOBIN A1C: Hemoglobin A1C: 7.6

## 2017-08-28 ENCOUNTER — Other Ambulatory Visit: Payer: Self-pay | Admitting: "Endocrinology

## 2017-09-03 ENCOUNTER — Encounter: Payer: Self-pay | Admitting: "Endocrinology

## 2017-09-03 ENCOUNTER — Ambulatory Visit (INDEPENDENT_AMBULATORY_CARE_PROVIDER_SITE_OTHER): Payer: Medicaid Other | Admitting: "Endocrinology

## 2017-09-03 VITALS — BP 134/82 | HR 88 | Ht 66.0 in | Wt 254.0 lb

## 2017-09-03 DIAGNOSIS — I10 Essential (primary) hypertension: Secondary | ICD-10-CM | POA: Diagnosis not present

## 2017-09-03 DIAGNOSIS — E782 Mixed hyperlipidemia: Secondary | ICD-10-CM

## 2017-09-03 DIAGNOSIS — E1165 Type 2 diabetes mellitus with hyperglycemia: Secondary | ICD-10-CM | POA: Diagnosis not present

## 2017-09-03 DIAGNOSIS — IMO0002 Reserved for concepts with insufficient information to code with codable children: Secondary | ICD-10-CM

## 2017-09-03 DIAGNOSIS — Z794 Long term (current) use of insulin: Secondary | ICD-10-CM

## 2017-09-03 DIAGNOSIS — E118 Type 2 diabetes mellitus with unspecified complications: Secondary | ICD-10-CM

## 2017-09-03 NOTE — Patient Instructions (Signed)

## 2017-09-03 NOTE — Progress Notes (Signed)
Subjective:    Patient ID: Kara Hopkins, female    DOB: 16-Aug-1970. Patient is being seen in f/u for management of diabetes requested by  Kirstie Peri, MD  Past Medical History:  Diagnosis Date  . Diabetes mellitus without complication (HCC)   . Hyperlipidemia   . Hypertension    Past Surgical History:  Procedure Laterality Date  . CESAREAN SECTION    . HERNIA REPAIR    . TIBIA IM NAIL INSERTION Left 09/29/2015   Procedure: OPEN REDUCTION INTERNAL FIXATION (ORIF) LEFT TIBIA  SHAFT FRACTURE;  Surgeon: Sheral Apley, MD;  Location: [facility];  Service: Orthopedics;  Laterality: Left;   Social History   Socioeconomic History  . Marital status: Married    Spouse name: None  . Number of children: None  . Years of education: None  . Highest education level: None  Social Needs  . Financial resource strain: None  . Food insecurity - worry: None  . Food insecurity - inability: None  . Transportation needs - medical: None  . Transportation needs - non-medical: None  Occupational History  . None  Tobacco Use  . Smoking status: Never Smoker  . Smokeless tobacco: Never Used  Substance and Sexual Activity  . Alcohol use: No  . Drug use: No  . Sexual activity: None  Other Topics Concern  . None  Social History Narrative  . None   Outpatient Encounter Medications as of 09/03/2017  Medication Sig  . Cholecalciferol (VITAMIN D3) 5000 units CAPS TAKE ONE CAPSULE BY MOUTH DAILY  . esomeprazole (NEXIUM) 40 MG capsule Take 40 mg by mouth daily at 12 noon.  Marland Kitchen HYDROcodone-acetaminophen (NORCO/VICODIN) 5-325 MG tablet Take 1 tablet by mouth 3 (three) times daily as needed for moderate pain.  Marland Kitchen ibuprofen (ADVIL,MOTRIN) 800 MG tablet Take 800 mg by mouth every 8 (eight) hours as needed for mild pain or moderate pain.   Marland Kitchen insulin aspart (NOVOLOG FLEXPEN) 100 UNIT/ML FlexPen INJECT 25-31 UNITS INTO THE SKIN THREE TIMES DAILY WITH MEALS  . Insulin Detemir (LEVEMIR  FLEXTOUCH) 100 UNIT/ML Pen Inject 90 Units at bedtime into the skin.  Marland Kitchen lisinopril (PRINIVIL,ZESTRIL) 10 MG tablet Take 10 mg by mouth daily.  Marland Kitchen omega-3 acid ethyl esters (LOVAZA) 1 g capsule TAKE TWO CAPSULES BY MOUTH TWICE DAILY  . pregabalin (LYRICA) 300 MG capsule Take 300 mg by mouth 2 (two) times daily.  . [DISCONTINUED] dexamethasone (DECADRON) 4 MG tablet Take 1 tablet (4 mg total) by mouth 2 (two) times daily with a meal.  . [DISCONTINUED] LEVEMIR FLEXTOUCH 100 UNIT/ML Pen INJECT 90 UNITS INTO THE SKIN AT BEDTIME  . [DISCONTINUED] NOVOLOG FLEXPEN 100 UNIT/ML FlexPen INJECT 25-31 UNITS INTO THE SKIN THREE TIMES DAILY WITH MEALS  . [DISCONTINUED] Phenylephrine-APAP-Guaifenesin (MUCINEX FAST-MAX) 10-650-400 MG/20ML LIQD Take 15 mLs by mouth daily as needed (for cough-congestion).   No facility-administered encounter medications on file as of 09/03/2017.    ALLERGIES: Allergies  Allergen Reactions  . Parsley Seed Anaphylaxis  . Codeine     Swelling   . Sulfa Antibiotics     Rash   . Penicillins Rash   VACCINATION STATUS:  There is no immunization history on file for this patient.  Diabetes  She presents for her follow-up diabetic visit. She has type 2 diabetes mellitus. Onset time: She was diagnosed at approximate age of 32 years preceded by episodes of gestational diabetes on 2 separate pregnancies in her 36s. Her disease course has been  improving. There are no hypoglycemic associated symptoms. Pertinent negatives for hypoglycemia include no confusion, headaches, pallor or seizures. Associated symptoms include fatigue, polydipsia and polyuria. Pertinent negatives for diabetes include no chest pain and no polyphagia. There are no hypoglycemic complications. Symptoms are improving. Diabetic complications include peripheral neuropathy. (She says she was hospitalized due to pancreatitis last month.) Risk factors for coronary artery disease include dyslipidemia, diabetes mellitus, tobacco  exposure, sedentary lifestyle, obesity and hypertension. Current diabetic treatments: She is on Levemir 80 units 3 times a day and NovoLog sliding scale, invokana 300 mg by mouth daily, and glipizide 5 mg twice a day. She is compliant with treatment none of the time. Her weight is decreasing steadily. She is following a generally unhealthy diet. When asked about meal planning, she reported none. She has not had a previous visit with a dietitian. She rarely participates in exercise. Her breakfast blood glucose range is generally 180-200 mg/dl. Her lunch blood glucose range is generally 180-200 mg/dl. Her dinner blood glucose range is generally 180-200 mg/dl. Her bedtime blood glucose range is generally 180-200 mg/dl. Her overall blood glucose range is 180-200 mg/dl. An ACE inhibitor/angiotensin II receptor blocker is not being taken. Eye exam is current.  Hypertension  This is a chronic problem. The current episode started more than 1 year ago. The problem is controlled. Pertinent negatives include no chest pain, headaches, palpitations or shortness of breath. Risk factors for coronary artery disease include obesity, dyslipidemia, diabetes mellitus, sedentary lifestyle, smoking/tobacco exposure and family history.  Hyperlipidemia  This is a chronic problem. The current episode started more than 1 year ago. The problem is uncontrolled (Triglycerides 744). Exacerbating diseases include diabetes and obesity. Pertinent negatives include no chest pain, myalgias or shortness of breath. She is currently on no antihyperlipidemic treatment. Risk factors for coronary artery disease include dyslipidemia, diabetes mellitus, hypertension, obesity and a sedentary lifestyle.     Review of Systems  Constitutional: Positive for fatigue. Negative for chills, fever and unexpected weight change.  HENT: Negative for trouble swallowing and voice change.   Eyes: Negative for visual disturbance.  Respiratory: Negative for cough,  shortness of breath and wheezing.   Cardiovascular: Negative for chest pain, palpitations and leg swelling.  Gastrointestinal: Negative for diarrhea, nausea and vomiting.  Endocrine: Positive for polydipsia and polyuria. Negative for cold intolerance, heat intolerance and polyphagia.  Musculoskeletal: Negative for arthralgias and myalgias.  Skin: Negative for color change, pallor, rash and wound.  Neurological: Negative for seizures and headaches.  Psychiatric/Behavioral: Negative for confusion and suicidal ideas.    Objective:    BP 134/82   Pulse 88   Ht 5\' 6"  (1.676 m)   Wt 254 lb (115.2 kg)   BMI 41.00 kg/m   Wt Readings from Last 3 Encounters:  09/03/17 254 lb (115.2 kg)  05/27/17 265 lb (120.2 kg)  02/19/17 253 lb (114.8 kg)    Physical Exam  Constitutional: She is oriented to person, place, and time. She appears well-developed.  HENT:  Head: Normocephalic and atraumatic.  Eyes: EOM are normal.  Neck: Normal range of motion. Neck supple. No tracheal deviation present. No thyromegaly present.  Cardiovascular: Normal rate and regular rhythm.  Pulmonary/Chest: Effort normal and breath sounds normal.  Abdominal: Soft. Bowel sounds are normal. There is no tenderness. There is no guarding.  Musculoskeletal: Normal range of motion. She exhibits no edema.  Neurological: She is alert and oriented to person, place, and time. She has normal reflexes. No cranial nerve deficit. Coordination  normal.  Skin: Skin is warm and dry. No rash noted. No erythema. No pallor.  Psychiatric: She has a normal mood and affect. Judgment normal.   Recent Results (from the past 2160 hour(s))  Basic metabolic panel     Status: None   Collection Time: 08/27/17 12:00 AM  Result Value Ref Range   BUN 16 4 - 21   Creatinine 0.5 0.5 - 1.1  Hemoglobin A1c     Status: None   Collection Time: 08/27/17 12:00 AM  Result Value Ref Range   Hemoglobin A1C 7.6       Assessment & Plan:   1. Uncontrolled  type 2 diabetes mellitus with complication, unspecified long term insulin use status (HCC)  - Patient has currently uncontrolled symptomatic type 2 DM since  47 years of age.  -She returns with significant improvement in her glucose profile, her A1c improving to 7.6% from 9.5%.    Her diabetes is complicated by peripheral neuropathy on Lyrica, morbid obesity/sedentary life and patient remains at a high risk for more acute and chronic complications of diabetes which include CAD, CVA, CKD, retinopathy, and neuropathy. These are all discussed in detail with the patient.  - I have counseled the patient on diet management and weight loss, by adopting a carbohydrate restricted/protein rich diet.  -  Suggestion is made for her to avoid simple carbohydrates  from her diet including Cakes, Sweet Desserts / Pastries, Ice Cream, Soda (diet and regular), Sweet Tea, Candies, Chips, Cookies, Store Bought Juices, Alcohol in Excess of  1-2 drinks a day, Artificial Sweeteners, and "Sugar-free" Products. This will help patient to have stable blood glucose profile and potentially avoid unintended weight gain.  - I encouraged the patient to switch to  unprocessed or minimally processed complex starch and increased protein intake (animal or plant source), fruits, and vegetables.  - Patient is advised to stick to a routine mealtimes to eat 3 meals  a day and avoid unnecessary snacks ( to snack only to correct hypoglycemia).   - I have approached patient with the following individualized plan to manage diabetes and patient agrees:   - She demonstrates better engagement lately, monitoring blood glucose and injecting insulin properly.  -That has helped her lower her A1c to 7.6%.   -She still requires large dose of insulin to mainntain this control. - I advised her to continue Levemir 90 units daily at bedtime , and  NovoLog  25 units 3 times a day before meals for pre-meal blood glucose readings of 90-150mg /dl, plus  patient specific correction dose for unexpected hyperglycemia above 150mg /dl, associated with strict monitoring of glucose 4 times a day-before meals and at bedtime .  - Patient is warned not to take insulin without proper monitoring per orders. -Adjustment parameters are given for hypo and hyperglycemia in writing. -Patient is encouraged to call clinic for blood glucose levels less than 70 or above 300 mg /dl. - She did not tolerate Invokana , nor metformin. -  she is not a good candidate for Bydureon given her current triglyceridemia of 744 and recent history of pancreatitis.  - Patient specific target  A1c;  LDL, HDL, Triglycerides, and  Waist Circumference were discussed in detail.  2) BP/HTN: Her blood pressure is controlled to target.  She is advised to continue  current medications including ACEI/ARB. 3) Lipids/HPL:   uncontrolled, triglycerides 744,  I advised her to continue Lovaza 2 g by mouth twice a day, she will be considered for additional statin  next visit.   4)  Weight/Diet: I discussed bariatric surgery and gave her information brochure, she is working with [facility] team.  She is being screened by various specialties for possible surgery.  CDE Consult has been  initiated , exercise, and detailed carbohydrates information provided.  5) vitamin D deficiency: - She is advised to continue vitamin D 5,000 units daily.  6) Chronic Care/Health Maintenance:  -Patient is on ACEI/ARB  medications and encouraged to continue to follow up with Ophthalmology, Podiatrist at least yearly or according to recommendations, and advised to   stay away from smoking. I have recommended yearly flu vaccine and pneumonia vaccination at least every 5 years; moderate intensity exercise for up to 150 minutes weekly; and  sleep for at least 7 hours a day.  - I advised patient to maintain close follow up with Kirstie Peri, MD for primary care needs.  - Time spent with the patient: 25  min, of which >50% was spent in reviewing her blood glucose logs , discussing her hypo- and hyper-glycemic episodes, reviewing her current and  previous labs and insulin doses and developing a plan to avoid hypo- and hyper-glycemia. Please refer to Patient Instructions for Blood Glucose Monitoring and Insulin/Medications Dosing Guide"  in media tab for additional information.   Follow up plan: - Return in about 3 months (around 12/01/2017) for meter, and logs.  Marquis Lunch, MD Phone: [phone]  Fax: [phone]  -  This note was partially dictated with voice recognition software. Similar sounding words can be transcribed inadequately or may not  be corrected upon review.  09/03/2017, 1:12 PM

## 2017-11-02 ENCOUNTER — Other Ambulatory Visit: Payer: Self-pay | Admitting: "Endocrinology

## 2017-11-05 ENCOUNTER — Encounter: Payer: Self-pay | Admitting: "Endocrinology

## 2017-11-05 LAB — HM DIABETES EYE EXAM

## 2017-11-24 ENCOUNTER — Other Ambulatory Visit: Payer: Self-pay | Admitting: "Endocrinology

## 2017-11-28 LAB — HEMOGLOBIN A1C: Hemoglobin A1C: 7.4

## 2017-11-28 LAB — LIPID PANEL
Cholesterol: 208 — AB (ref 0–200)
HDL: 48 (ref 35–70)
LDL Cholesterol: 119
Triglycerides: 203 — AB (ref 40–160)

## 2017-11-28 LAB — BASIC METABOLIC PANEL
BUN: 11 (ref 4–21)
Creatinine: 0.5 (ref <=1.1)

## 2017-12-02 ENCOUNTER — Other Ambulatory Visit: Payer: Self-pay | Admitting: "Endocrinology

## 2017-12-05 ENCOUNTER — Encounter: Payer: Self-pay | Admitting: "Endocrinology

## 2017-12-05 ENCOUNTER — Ambulatory Visit (INDEPENDENT_AMBULATORY_CARE_PROVIDER_SITE_OTHER): Payer: Medicaid Other | Admitting: "Endocrinology

## 2017-12-05 VITALS — BP 114/79 | HR 94 | Ht 66.0 in | Wt 249.8 lb

## 2017-12-05 DIAGNOSIS — E782 Mixed hyperlipidemia: Secondary | ICD-10-CM | POA: Diagnosis not present

## 2017-12-05 DIAGNOSIS — E1165 Type 2 diabetes mellitus with hyperglycemia: Secondary | ICD-10-CM

## 2017-12-05 DIAGNOSIS — IMO0002 Reserved for concepts with insufficient information to code with codable children: Secondary | ICD-10-CM

## 2017-12-05 DIAGNOSIS — Z794 Long term (current) use of insulin: Secondary | ICD-10-CM | POA: Diagnosis not present

## 2017-12-05 DIAGNOSIS — E118 Type 2 diabetes mellitus with unspecified complications: Secondary | ICD-10-CM

## 2017-12-05 DIAGNOSIS — I10 Essential (primary) hypertension: Secondary | ICD-10-CM

## 2017-12-05 MED ORDER — INSULIN ASPART 100 UNIT/ML FLEXPEN: PEN_INJECTOR | SUBCUTANEOUS | 2 refills | Status: DC

## 2017-12-05 NOTE — Progress Notes (Signed)
Subjective:    Patient ID: Kara Hopkins, female    DOB: [birth year]. Patient is being seen in f/u for management of diabetes requested by  Kirstie Peri, MD  Past Medical History:  Diagnosis Date  . Diabetes mellitus without complication (HCC)   . Hyperlipidemia   . Hypertension    Past Surgical History:  Procedure Laterality Date  . CESAREAN SECTION    . HERNIA REPAIR    . TIBIA IM NAIL INSERTION Left 09/29/2015   Procedure: OPEN REDUCTION INTERNAL FIXATION (ORIF) LEFT TIBIA  SHAFT FRACTURE;  Surgeon: Sheral Apley, MD;  Location: [facility];  Service: Orthopedics;  Laterality: Left;   Social History   Socioeconomic History  . Marital status: Married    Spouse name: Not on file  . Number of children: Not on file  . Years of education: Not on file  . Highest education level: Not on file  Occupational History  . Not on file  Social Needs  . Financial resource strain: Not on file  . Food insecurity:    Worry: Not on file    Inability: Not on file  . Transportation needs:    Medical: Not on file    Non-medical: Not on file  Tobacco Use  . Smoking status: Never Smoker  . Smokeless tobacco: Never Used  Substance and Sexual Activity  . Alcohol use: No  . Drug use: No  . Sexual activity: Not on file  Lifestyle  . Physical activity:    Days per week: Not on file    Minutes per session: Not on file  . Stress: Not on file  Relationships  . Social connections:    Talks on phone: Not on file    Gets together: Not on file    Attends religious service: Not on file    Active member of club or organization: Not on file    Attends meetings of clubs or organizations: Not on file    Relationship status: Not on file  Other Topics Concern  . Not on file  Social History Narrative  . Not on file   Outpatient Encounter Medications as of 12/05/2017  Medication Sig  . Cholecalciferol (VITAMIN D3) 5000 units CAPS TAKE 1 TABLET BY MOUTH DAILY  . esomeprazole  (NEXIUM) 40 MG capsule Take 40 mg by mouth daily at 12 noon.  Marland Kitchen HYDROcodone-acetaminophen (NORCO/VICODIN) 5-325 MG tablet Take 1 tablet by mouth 3 (three) times daily as needed for moderate pain.  Marland Kitchen ibuprofen (ADVIL,MOTRIN) 800 MG tablet Take 800 mg by mouth every 8 (eight) hours as needed for mild pain or moderate pain.   Marland Kitchen insulin aspart (NOVOLOG FLEXPEN) 100 UNIT/ML FlexPen INJECT 15-21 UNITS INTO THE SKIN THREE TIMES DAILY WITH MEALS  . Insulin Detemir (LEVEMIR FLEXTOUCH) 100 UNIT/ML Pen Inject 90 Units at bedtime into the skin.  Marland Kitchen lisinopril (PRINIVIL,ZESTRIL) 10 MG tablet Take 10 mg by mouth daily.  . pregabalin (LYRICA) 300 MG capsule Take 300 mg by mouth 2 (two) times daily.  . [DISCONTINUED] NOVOLOG FLEXPEN 100 UNIT/ML FlexPen INJECT 25-31 UNITS INTO THE SKIN THREE TIMES DAILY WITH MEALS  . omega-3 acid ethyl esters (LOVAZA) 1 g capsule TAKE TWO CAPSULES BY MOUTH TWICE DAILY (Patient not taking: Reported on 12/05/2017)   No facility-administered encounter medications on file as of 12/05/2017.    ALLERGIES: Allergies  Allergen Reactions  . Parsley Seed Anaphylaxis  . Codeine     Swelling   . Sulfa Antibiotics  Rash   . Penicillins Rash   VACCINATION STATUS:  There is no immunization history on file for this patient.  Diabetes  She presents for her follow-up diabetic visit. She has type 2 diabetes mellitus. Onset time: She was diagnosed at approximate age of 32 years preceded by episodes of gestational diabetes on 2 separate pregnancies in her 36s. Her disease course has been improving. There are no hypoglycemic associated symptoms. Pertinent negatives for hypoglycemia include no confusion, headaches, pallor or seizures. Pertinent negatives for diabetes include no chest pain, no fatigue, no polydipsia, no polyphagia and no polyuria. There are no hypoglycemic complications. Symptoms are improving. Diabetic complications include peripheral neuropathy. (She says she was hospitalized  due to pancreatitis last month.) Risk factors for coronary artery disease include dyslipidemia, diabetes mellitus, tobacco exposure, sedentary lifestyle, obesity and hypertension. Current diabetic treatments: She is on Levemir 80 units 3 times a day and NovoLog sliding scale, invokana 300 mg by mouth daily, and glipizide 5 mg twice a day. She is compliant with treatment none of the time. Her weight is decreasing steadily. She is following a generally unhealthy diet. When asked about meal planning, she reported none. She has not had a previous visit with a dietitian. She rarely participates in exercise. Her breakfast blood glucose range is generally 140-180 mg/dl. Her lunch blood glucose range is generally 140-180 mg/dl. Her dinner blood glucose range is generally 140-180 mg/dl. Her bedtime blood glucose range is generally 140-180 mg/dl. Her overall blood glucose range is 140-180 mg/dl. An ACE inhibitor/angiotensin II receptor blocker is not being taken. Eye exam is current.  Hypertension  This is a chronic problem. The current episode started more than 1 year ago. The problem is controlled. Pertinent negatives include no chest pain, headaches, palpitations or shortness of breath. Risk factors for coronary artery disease include obesity, dyslipidemia, diabetes mellitus, sedentary lifestyle, smoking/tobacco exposure and family history.  Hyperlipidemia  This is a chronic problem. The current episode started more than 1 year ago. The problem is uncontrolled (Triglycerides 744). Exacerbating diseases include diabetes and obesity. Pertinent negatives include no chest pain, myalgias or shortness of breath. She is currently on no antihyperlipidemic treatment. Risk factors for coronary artery disease include dyslipidemia, diabetes mellitus, hypertension, obesity and a sedentary lifestyle.     Review of Systems  Constitutional: Negative for chills, fatigue, fever and unexpected weight change.  HENT: Negative for  trouble swallowing and voice change.   Eyes: Negative for visual disturbance.  Respiratory: Negative for cough, shortness of breath and wheezing.   Cardiovascular: Negative for chest pain, palpitations and leg swelling.  Gastrointestinal: Negative for diarrhea, nausea and vomiting.  Endocrine: Negative for cold intolerance, heat intolerance, polydipsia, polyphagia and polyuria.  Musculoskeletal: Negative for arthralgias and myalgias.  Skin: Negative for color change, pallor, rash and wound.  Neurological: Negative for seizures and headaches.  Psychiatric/Behavioral: Negative for confusion and suicidal ideas.    Objective:    BP 114/79   Pulse 94   Ht  (1.676 m)   Wt 249 lb 12.8 oz (113.3 kg)   BMI 40.32 kg/m   Wt Readings from Last 3 Encounters:  12/05/17 249 lb 12.8 oz (113.3 kg)  09/03/17 254 lb (115.2 kg)  05/27/17 265 lb (120.2 kg)    Physical Exam  Constitutional: She is oriented to person, place, and time. She appears well-developed.  HENT:  Head: Normocephalic and atraumatic.  Eyes: EOM are normal.  Neck: Normal range of motion. Neck supple. No tracheal deviation present.  No thyromegaly present.  Cardiovascular: Normal rate.  Pulmonary/Chest: Effort normal.  Abdominal: There is no tenderness. There is no guarding.  Musculoskeletal: Normal range of motion. She exhibits no edema.  Neurological: She is alert and oriented to person, place, and time. She has normal reflexes. No cranial nerve deficit. Coordination normal.  Skin: Skin is warm and dry. No rash noted. No erythema. No pallor.  Psychiatric: She has a normal mood and affect. Judgment normal.   Recent Results (from the past 2160 hour(s))  Basic metabolic panel     Status: None   Collection Time: 11/28/17 12:00 AM  Result Value Ref Range   BUN 11 4 - 21   Creatinine 0.5 0.5 - 1.1  Lipid panel     Status: Abnormal   Collection Time: 11/28/17 12:00 AM  Result Value Ref Range   Triglycerides 203 (A) 40 -  160   Cholesterol 208 (A) 0 - 200   HDL 48 35 - 70   LDL Cholesterol 119   Hemoglobin A1c     Status: None   Collection Time: 11/28/17 12:00 AM  Result Value Ref Range   Hemoglobin A1C 7.4       Assessment & Plan:   1. Uncontrolled type 2 diabetes mellitus with complication, unspecified long term insulin use status (HCC)  - Patient has currently uncontrolled symptomatic type 2 DM since  47 years of age.  -She returns with significant improvement in her glucose profile, her A1c improving to 7.4 % , generally improving from 9.5%.  -She is currently undergoing evaluation for bariatric surgery which is planned for middle of June 2019.   Her diabetes is complicated by peripheral neuropathy on Lyrica, morbid obesity/sedentary life and patient remains at a high risk for more acute and chronic complications of diabetes which include CAD, CVA, CKD, retinopathy, and neuropathy. These are all discussed in detail with the patient.  - I have counseled the patient on diet management and weight loss, by adopting a carbohydrate restricted/protein rich diet.  -  Suggestion is made for her to avoid simple carbohydrates  from her diet including Cakes, Sweet Desserts / Pastries, Ice Cream, Soda (diet and regular), Sweet Tea, Candies, Chips, Cookies, Store Bought Juices, Alcohol in Excess of  1-2 drinks a day, Artificial Sweeteners, and "Sugar-free" Products. This will help patient to have stable blood glucose profile and potentially avoid unintended weight gain.   - I encouraged the patient to switch to  unprocessed or minimally processed complex starch and increased protein intake (animal or plant source), fruits, and vegetables.  - Patient is advised to stick to a routine mealtimes to eat 3 meals  a day and avoid unnecessary snacks ( to snack only to correct hypoglycemia).   - I have approached patient with the following individualized plan to manage diabetes and patient agrees:   - She demonstrates  better engagement lately, monitoring blood glucose and injecting insulin properly.  -That has helped her lower her A1c to 7.4%, in preparation for her bariatric surgery.  -She still requires large dose of insulin to mainntain this control. - I advised her to continue Levemir 90 units daily at bedtime , and lower NovoLog to 15 units  3 times a day before meals for pre-meal blood glucose readings of 90-150mg /dl, plus patient specific correction dose for unexpected hyperglycemia above /dl, associated with strict monitoring of glucose 4 times a day-before meals and at bedtime .  - Patient is warned not to take insulin without proper  monitoring per orders. -Adjustment parameters are given for hypo and hyperglycemia in writing. -Patient is encouraged to call clinic for blood glucose levels less than 70 or above 300 mg /dl. - She did not tolerate Invokana , nor metformin. -Her triglycerides have improved to 203 from 744.  This opens opportunity for her to use GLP-1 receptor agonists after her surgery instead of insulin if necessary.   - Patient specific target  A1c;  LDL, HDL, Triglycerides, and  Waist Circumference were discussed in detail.  2) BP/HTN: Her blood pressure is controlled to target.  She is advised to continue her current blood pressure medications including lisinopril 10 mg p.o. daily.  3) Lipids/HPL: Significantly  improved lipid panel with triglycerides 203 improving from 744.  She is advised to continue Lovaza 2 g p.o. twice daily.  - she will be considered for additional statin next visit.   4)  Weight/Diet: She is at the final stage of her bariatric surgery processing.  She is scheduled to have surgery in the middle of June 2019.  She will require significantly lower dose of insulin starting immediately after her surgery.  She is advised to return within a week of discharge after her surgery. CDE Consult has been  initiated , exercise, and detailed carbohydrates information  provided.  5) vitamin D deficiency: - She is advised to continue vitamin D 5,000 units daily.  6) Chronic Care/Health Maintenance:  -Patient is on ACEI/ARB  medications and encouraged to continue to follow up with Ophthalmology, Podiatrist at least yearly or according to recommendations, and advised to   stay away from smoking. I have recommended yearly flu vaccine and pneumonia vaccination at least every 5 years; moderate intensity exercise for up to 150 minutes weekly; and  sleep for at least 7 hours a day.  - I advised patient to maintain close follow up with Kirstie Peri, MD for primary care needs.  - Time spent with the patient: 25 min, of which >50% was spent in reviewing her blood glucose logs , discussing her hypo- and hyper-glycemic episodes, reviewing her current and  previous labs and insulin doses and developing a plan to avoid hypo- and hyper-glycemia. Please refer to Patient Instructions for Blood Glucose Monitoring and Insulin/Medications Dosing Guide"  in media tab for additional information. [name] participated in the discussions, expressed understanding, and voiced agreement with the above plans.  All questions were answered to her satisfaction. she is encouraged to contact clinic should she have any questions or concerns prior to her return visit.   Follow up plan: - Return in about 6 weeks (around 01/16/2018) for follow up with meter and logs- no labs.  Marquis Lunch, MD Phone: [phone]  Fax: [phone]  -  This note was partially dictated with voice recognition software. Similar sounding words can be transcribed inadequately or may not  be corrected upon review.  12/05/2017, 12:30 PM

## 2017-12-15 ENCOUNTER — Other Ambulatory Visit: Payer: Self-pay | Admitting: "Endocrinology

## 2017-12-31 ENCOUNTER — Ambulatory Visit: Payer: Medicaid Other | Admitting: "Endocrinology

## 2018-01-08 ENCOUNTER — Telehealth: Payer: Self-pay | Admitting: "Endocrinology

## 2018-01-08 NOTE — Telephone Encounter (Signed)
Kara Hopkins is calling asking for a call back from Dr. Fransico HimNida in regards to her Insulin, please advise?

## 2018-01-08 NOTE — Telephone Encounter (Signed)
I spoke with her and advised her to increase Levemir to 30 units  qhs and stop Novolog.

## 2018-01-28 ENCOUNTER — Encounter: Payer: Self-pay | Admitting: "Endocrinology

## 2018-01-28 ENCOUNTER — Ambulatory Visit (INDEPENDENT_AMBULATORY_CARE_PROVIDER_SITE_OTHER): Payer: Medicaid Other | Admitting: "Endocrinology

## 2018-01-28 VITALS — BP 115/77 | HR 78 | Ht 66.0 in | Wt 224.0 lb

## 2018-01-28 DIAGNOSIS — Z794 Long term (current) use of insulin: Secondary | ICD-10-CM

## 2018-01-28 DIAGNOSIS — E782 Mixed hyperlipidemia: Secondary | ICD-10-CM | POA: Diagnosis not present

## 2018-01-28 DIAGNOSIS — E1165 Type 2 diabetes mellitus with hyperglycemia: Secondary | ICD-10-CM | POA: Diagnosis not present

## 2018-01-28 DIAGNOSIS — Z9884 Bariatric surgery status: Secondary | ICD-10-CM | POA: Insufficient documentation

## 2018-01-28 DIAGNOSIS — I10 Essential (primary) hypertension: Secondary | ICD-10-CM

## 2018-01-28 DIAGNOSIS — E118 Type 2 diabetes mellitus with unspecified complications: Secondary | ICD-10-CM

## 2018-01-28 DIAGNOSIS — IMO0002 Reserved for concepts with insufficient information to code with codable children: Secondary | ICD-10-CM

## 2018-01-28 NOTE — Progress Notes (Signed)
Endocrinology follow-up note   Subjective:    Patient ID: Kara Hopkins, female    DOB: 09/09/1970. Patient is being seen in follow-up for management of diabetes requested by  Kirstie Peri, MD  Past Medical History:  Diagnosis Date  . Diabetes mellitus without complication (HCC)   . Hyperlipidemia   . Hypertension    Past Surgical History:  Procedure Laterality Date  . CESAREAN SECTION    . HERNIA REPAIR    . TIBIA IM NAIL INSERTION Left 09/29/2015   Procedure: OPEN REDUCTION INTERNAL FIXATION (ORIF) LEFT TIBIA  SHAFT FRACTURE;  Surgeon: Sheral Apley, MD;  Location: [facility];  Service: Orthopedics;  Laterality: Left;   Social History   Socioeconomic History  . Marital status: Married    Spouse name: Not on file  . Number of children: Not on file  . Years of education: Not on file  . Highest education level: Not on file  Occupational History  . Not on file  Social Needs  . Financial resource strain: Not on file  . Food insecurity:    Worry: Not on file    Inability: Not on file  . Transportation needs:    Medical: Not on file    Non-medical: Not on file  Tobacco Use  . Smoking status: Never Smoker  . Smokeless tobacco: Never Used  Substance and Sexual Activity  . Alcohol use: No  . Drug use: No  . Sexual activity: Not on file  Lifestyle  . Physical activity:    Days per week: Not on file    Minutes per session: Not on file  . Stress: Not on file  Relationships  . Social connections:    Talks on phone: Not on file    Gets together: Not on file    Attends religious service: Not on file    Active member of club or organization: Not on file    Attends meetings of clubs or organizations: Not on file    Relationship status: Not on file  Other Topics Concern  . Not on file  Social History Narrative  . Not on file   Outpatient Encounter Medications as of 01/28/2018  Medication Sig  . Cholecalciferol (VITAMIN D3) 5000 units CAPS TAKE 1  TABLET BY MOUTH DAILY  . esomeprazole (NEXIUM) 40 MG capsule Take 40 mg by mouth daily at 12 noon.  Marland Kitchen HYDROcodone-acetaminophen (NORCO/VICODIN) 5-325 MG tablet Take 1 tablet by mouth 3 (three) times daily as needed for moderate pain.  Marland Kitchen ibuprofen (ADVIL,MOTRIN) 800 MG tablet Take 800 mg by mouth every 8 (eight) hours as needed for mild pain or moderate pain.   . Insulin Detemir (LEVEMIR FLEXTOUCH) 100 UNIT/ML Pen Inject 90 Units at bedtime into the skin. (Patient taking differently: Inject 30 Units into the skin at bedtime. )  . NATURAL VITAMIN D-3 5000 units TABS TAKE 1 TABLET BY MOUTH DAILY  . omega-3 acid ethyl esters (LOVAZA) 1 g capsule TAKE TWO CAPSULES BY MOUTH TWICE DAILY (Patient not taking: Reported on 12/05/2017)  . pregabalin (LYRICA) 300 MG capsule Take 300 mg by mouth 2 (two) times daily.  . [DISCONTINUED] insulin aspart (NOVOLOG FLEXPEN) 100 UNIT/ML FlexPen INJECT 15-21 UNITS INTO THE SKIN THREE TIMES DAILY WITH MEALS  . [DISCONTINUED] lisinopril (PRINIVIL,ZESTRIL) 10 MG tablet Take 10 mg by mouth daily.   No facility-administered encounter medications on file as of 01/28/2018.    ALLERGIES: Allergies  Allergen Reactions  . Parsley Seed Anaphylaxis  . Codeine  Swelling   . Sulfa Antibiotics     Rash   . Penicillins Rash   VACCINATION STATUS:  There is no immunization history on file for this patient.  Diabetes  She presents for her follow-up diabetic visit. She has type 2 diabetes mellitus. Onset time: She was diagnosed at approximate age of 32 years preceded by episodes of gestational diabetes on 2 separate pregnancies in her 6120s. Her disease course has been improving (She underwent gastric bypass for weight loss since her last visit.). There are no hypoglycemic associated symptoms. Pertinent negatives for hypoglycemia include no confusion, headaches, pallor or seizures. Pertinent negatives for diabetes include no chest pain, no fatigue, no polydipsia, no polyphagia and  no polyuria. There are no hypoglycemic complications. Symptoms are improving. Diabetic complications include peripheral neuropathy. (She says she was hospitalized due to pancreatitis last month.) Risk factors for coronary artery disease include dyslipidemia, diabetes mellitus, tobacco exposure, sedentary lifestyle, obesity and hypertension. Current diabetic treatments: She is on Levemir 80 units 3 times a day and NovoLog sliding scale, invokana 300 mg by mouth daily, and glipizide 5 mg twice a day. She is compliant with treatment none of the time. Her weight is decreasing steadily (She has lost a total of 45 pounds so far.). When asked about meal planning, she reported none. She has had a previous visit with a dietitian. She rarely participates in exercise. Her breakfast blood glucose range is generally 140-180 mg/dl. Her bedtime blood glucose range is generally 130-140 mg/dl. Her overall blood glucose range is 140-180 mg/dl. An ACE inhibitor/angiotensin II receptor blocker is not being taken. Eye exam is current.  Hypertension  This is a chronic problem. The current episode started more than 1 year ago. The problem is controlled. Pertinent negatives include no chest pain, headaches, palpitations or shortness of breath. Risk factors for coronary artery disease include obesity, dyslipidemia, diabetes mellitus, sedentary lifestyle, smoking/tobacco exposure and family history.  Hyperlipidemia  This is a chronic problem. The current episode started more than 1 year ago. The problem is uncontrolled (Triglycerides 744). Exacerbating diseases include diabetes and obesity. Pertinent negatives include no chest pain, myalgias or shortness of breath. She is currently on no antihyperlipidemic treatment. Risk factors for coronary artery disease include dyslipidemia, diabetes mellitus, hypertension, obesity and a sedentary lifestyle.     Review of Systems  Constitutional: Negative for chills, fatigue, fever and  unexpected weight change.  HENT: Negative for trouble swallowing and voice change.   Eyes: Negative for visual disturbance.  Respiratory: Negative for cough, shortness of breath and wheezing.   Cardiovascular: Negative for chest pain, palpitations and leg swelling.  Gastrointestinal: Negative for diarrhea, nausea and vomiting.  Endocrine: Negative for cold intolerance, heat intolerance, polydipsia, polyphagia and polyuria.  Musculoskeletal: Negative for arthralgias and myalgias.  Skin: Negative for color change, pallor, rash and wound.  Neurological: Negative for seizures and headaches.  Psychiatric/Behavioral: Negative for confusion and suicidal ideas.    Objective:    BP 115/77   Pulse 78   Wt 224 lb (101.6 kg)   BMI 36.15 kg/m   Wt Readings from Last 3 Encounters:  01/28/18 224 lb (101.6 kg)  12/05/17 249 lb 12.8 oz (113.3 kg)  09/03/17 254 lb (115.2 kg)    Physical Exam  Constitutional: She is oriented to person, place, and time. She appears well-developed.  HENT:  Head: Normocephalic and atraumatic.  Eyes: EOM are normal.  Neck: Normal range of motion. Neck supple. No tracheal deviation present. No thyromegaly present.  Cardiovascular: Normal rate.  Pulmonary/Chest: Effort normal.  Abdominal: There is no tenderness. There is no guarding.  Musculoskeletal: Normal range of motion. She exhibits no edema.  Neurological: She is alert and oriented to person, place, and time. She has normal reflexes. No cranial nerve deficit. Coordination normal.  Skin: Skin is warm and dry. No rash noted. No erythema. No pallor.  Psychiatric: She has a normal mood and affect. Judgment normal.   Recent Results (from the past 2160 hour(s))  HM DIABETES EYE EXAM     Status: None   Collection Time: 11/05/17  1:11 PM  Result Value Ref Range   [facility] Diabetic Eye Exam  No Retinopathy  Basic metabolic panel     Status: None   Collection Time: 11/28/17 12:00 AM  Result Value Ref Range   BUN 11 4 -  21   Creatinine 0.5 0.5 - 1.1  Lipid panel     Status: Abnormal   Collection Time: 11/28/17 12:00 AM  Result Value Ref Range   Triglycerides 203 (A) 40 - 160   Cholesterol 208 (A) 0 - 200   HDL 48 35 - 70   LDL Cholesterol 119   Hemoglobin A1c     Status: None   Collection Time: 11/28/17 12:00 AM  Result Value Ref Range   Hemoglobin A1C 7.4       Assessment & Plan:   1. Uncontrolled type 2 diabetes mellitus with complication, unspecified long term insulin use status (HCC)  - Patient has currently uncontrolled symptomatic type 2 DM since  47 years of age.  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    -Prior to her surgery A1c was 7.4%.  She did have A1c as high as 9.5% last year.   -She continues to feel better, lost 45 pounds overall so far.   Her diabetes is complicated by peripheral neuropathy on Lyrica,  sedentary life and patient remains at a high risk for more acute and chronic complications of diabetes which include CAD, CVA, CKD, retinopathy, and neuropathy. These are all discussed in detail with the patient.  - I have counseled the patient on diet management and weight loss, by adopting a carbohydrate restricted/protein rich diet.  -  Suggestion is made for her to avoid simple carbohydrates  from her diet including Cakes, Sweet Desserts / Pastries, Ice Cream, Soda (diet and regular), Sweet Tea, Candies, Chips, Cookies, Store Bought Juices, Alcohol in Excess of  1-2 drinks a day, Artificial Sweeteners, and "Sugar-free" Products. This will help patient to have stable blood glucose profile and potentially avoid unintended weight gain.  - I encouraged the patient to switch to  unprocessed or minimally processed complex starch and increased protein intake (animal or plant source),  fruits, and vegetables.  - Patient is advised to stick to a routine mealtimes to eat 3 meals  a day and avoid unnecessary snacks ( to snack only to correct hypoglycemia).   - I have approached patient with the following individualized plan to manage diabetes and patient agrees:   - She is recovering from her recent Roux-en-Y gastric bypass which resulted in 45 pounds of weight loss so far. -Her insulin dose was significantly reduced after her surgery. -I approached her with even lower dose of Levemir to 20 units daily in the morning with strict monitoring of  blood glucose 2 times a day-before breakfast and at bedtime. - Patient is warned not to take insulin without proper monitoring per orders. -Patient is encouraged to call clinic for blood glucose levels less than 70 or above 300 mg /dl. - She did not tolerate metformin. -Her triglycerides have improved to 203 from 744.  This opens up an  opportunity for her to use GLP-1 receptor agonists on her next visit if A1c remains above 7%.    - Patient specific target  A1c;  LDL, HDL, Triglycerides, and  Waist Circumference were discussed in detail.  2) BP/HTN: Her blood pressure is controlled to target.  She is advised to continue her current blood pressure medications including lisinopril 10 mg p.o. daily.   3) Lipids/HPL: Significantly  improved lipid panel with triglycerides 203 improving from 744.  She is advised to continue Lovaza 2 g p.o. twice daily.  She will have repeat lipid panel in 57-month on fasting.   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    CDE Consult in progress,  exercise, and detailed carbohydrates information provided.  5) vitamin D deficiency: - She is advised to continue vitamin D 5,000 units daily.  Will have repeat 25 hydroxy vitamin D and vitamin B12 measurements during her next labs.  6) Chronic  Care/Health Maintenance:  -Patient is on ACEI/ARB  medications and encouraged to continue to follow up with Ophthalmology, Podiatrist at least yearly or according to recommendations, and advised to   stay away from smoking. I have recommended yearly flu vaccine and pneumonia vaccination at least every 5 years; moderate intensity exercise for up to 150 minutes weekly; and  sleep for at least 7 hours a day.  - I advised patient to maintain close follow up with Kirstie Peri, MD for primary care needs.  - Time spent with the patient: 25 min, of which >50% was spent in reviewing her blood glucose logs , discussing her hypo- and hyper-glycemic episodes, reviewing her current and  previous labs and insulin doses and developing a plan to avoid hypo- and hyper-glycemia. Please refer to Patient Instructions for Blood Glucose Monitoring and Insulin/Medications Dosing Guide"  in media tab for additional information. [name] participated in the discussions, expressed understanding, and voiced agreement with the above plans.  All questions were answered to her satisfaction. she is encouraged to contact clinic should she have any questions or concerns prior to her return visit.   Follow up plan: - Return in about 6 weeks (around 03/11/2018) for follow up with pre-visit labs, meter, and logs.  Marquis Lunch, MD Phone: [phone]  Fax: [phone]  -  This note was partially dictated with voice recognition software. Similar sounding words can be transcribed inadequately or may not  be corrected upon review.  01/28/2018, 4:03 PM

## 2018-01-28 NOTE — Patient Instructions (Signed)

## 2018-02-01 ENCOUNTER — Other Ambulatory Visit: Payer: Self-pay | Admitting: "Endocrinology

## 2018-02-12 ENCOUNTER — Other Ambulatory Visit: Payer: Self-pay | Admitting: "Endocrinology

## 2018-02-12 MED ORDER — VITAMIN D3 125 MCG (5000 UT) PO CAPS: 5000.0000 [IU] | ORAL_CAPSULE | Freq: Every day | ORAL | 0 refills | Status: DC

## 2018-03-03 ENCOUNTER — Encounter: Payer: Self-pay | Admitting: "Endocrinology

## 2018-03-03 LAB — VITAMIN B12: Vitamin B-12: 691

## 2018-03-03 LAB — TSH: TSH: 3.17 (ref 0.41–5.90)

## 2018-03-03 LAB — VITAMIN D 25 HYDROXY (VIT D DEFICIENCY, FRACTURES): Vit D, 25-Hydroxy: 52.8

## 2018-03-03 LAB — MICROALBUMIN, URINE: Microalb, Ur: 8.4

## 2018-03-03 LAB — BASIC METABOLIC PANEL
BUN: 12 (ref 4–21)
Creatinine: 0.5 (ref 0.5–1.1)

## 2018-03-03 LAB — HEMOGLOBIN A1C: Hemoglobin A1C: 6.3

## 2018-03-11 ENCOUNTER — Encounter: Payer: Self-pay | Admitting: "Endocrinology

## 2018-03-11 ENCOUNTER — Ambulatory Visit (INDEPENDENT_AMBULATORY_CARE_PROVIDER_SITE_OTHER): Payer: Medicaid Other | Admitting: "Endocrinology

## 2018-03-11 VITALS — BP 105/74 | HR 76 | Ht 66.0 in | Wt 209.0 lb

## 2018-03-11 DIAGNOSIS — E782 Mixed hyperlipidemia: Secondary | ICD-10-CM | POA: Diagnosis not present

## 2018-03-11 DIAGNOSIS — E559 Vitamin D deficiency, unspecified: Secondary | ICD-10-CM

## 2018-03-11 DIAGNOSIS — E118 Type 2 diabetes mellitus with unspecified complications: Secondary | ICD-10-CM | POA: Diagnosis not present

## 2018-03-11 DIAGNOSIS — Z794 Long term (current) use of insulin: Secondary | ICD-10-CM

## 2018-03-11 DIAGNOSIS — IMO0002 Reserved for concepts with insufficient information to code with codable children: Secondary | ICD-10-CM

## 2018-03-11 DIAGNOSIS — E1165 Type 2 diabetes mellitus with hyperglycemia: Secondary | ICD-10-CM

## 2018-03-11 DIAGNOSIS — I10 Essential (primary) hypertension: Secondary | ICD-10-CM

## 2018-03-11 MED ORDER — FREESTYLE LIBRE 14 DAY SENSOR MISC: 1.0000 | 2 refills | Status: AC

## 2018-03-11 MED ORDER — INSULIN DETEMIR 100 UNIT/ML FLEXPEN: 20.0000 [IU] | PEN_INJECTOR | Freq: Every day | SUBCUTANEOUS | 2 refills | Status: AC

## 2018-03-11 MED ORDER — FREESTYLE LIBRE 14 DAY READER DEVI: 1.0000 | Freq: Once | 0 refills | Status: AC

## 2018-03-11 NOTE — Patient Instructions (Signed)

## 2018-03-11 NOTE — Progress Notes (Signed)
Endocrinology follow-up note   Subjective:    Patient ID: Kara Hopkins, female    DOB: 1970/07/18. Patient is being seen in follow-up for management of type 2 diabetes, hyperlipidemia, hypertension, obesity. PMD:   Kirstie Peri, MD  Past Medical History:  Diagnosis Date  . Diabetes mellitus without complication (HCC)   . Hyperlipidemia   . Hypertension    Past Surgical History:  Procedure Laterality Date  . CESAREAN SECTION    . HERNIA REPAIR    . TIBIA IM NAIL INSERTION Left 09/29/2015   Procedure: OPEN REDUCTION INTERNAL FIXATION (ORIF) LEFT TIBIA  SHAFT FRACTURE;  Surgeon: Sheral Apley, MD;  Location: [facility];  Service: Orthopedics;  Laterality: Left;   Social History   Socioeconomic History  . Marital status: Married    Spouse name: Not on file  . Number of children: Not on file  . Years of education: Not on file  . Highest education level: Not on file  Occupational History  . Not on file  Social Needs  . Financial resource strain: Not on file  . Food insecurity:    Worry: Not on file    Inability: Not on file  . Transportation needs:    Medical: Not on file    Non-medical: Not on file  Tobacco Use  . Smoking status: Never Smoker  . Smokeless tobacco: Never Used  Substance and Sexual Activity  . Alcohol use: No  . Drug use: No  . Sexual activity: Not on file  Lifestyle  . Physical activity:    Days per week: Not on file    Minutes per session: Not on file  . Stress: Not on file  Relationships  . Social connections:    Talks on phone: Not on file    Gets together: Not on file    Attends religious service: Not on file    Active member of club or organization: Not on file    Attends meetings of clubs or organizations: Not on file    Relationship status: Not on file  Other Topics Concern  . Not on file  Social History Narrative  . Not on file   Outpatient Encounter Medications as of 03/11/2018  Medication Sig  .  Cholecalciferol (VITAMIN D3) 5000 units CAPS Take 1 capsule (5,000 Units total) by mouth daily.  Marland Kitchen esomeprazole (NEXIUM) 40 MG capsule Take 40 mg by mouth daily at 12 noon.  . Insulin Detemir (LEVEMIR FLEXTOUCH) 100 UNIT/ML Pen Inject 20 Units into the skin at bedtime.  Marland Kitchen oxyCODONE (OXY IR/ROXICODONE) 5 MG immediate release tablet every 8 (eight) hours as needed.  . pregabalin (LYRICA) 300 MG capsule Take 300 mg by mouth 2 (two) times daily.  . promethazine (PHENERGAN) 25 MG tablet Take 25 mg by mouth.  . [DISCONTINUED] Insulin Detemir (LEVEMIR FLEXTOUCH) 100 UNIT/ML Pen Inject 90 Units at bedtime into the skin. (Patient taking differently: Inject 20 Units into the skin at bedtime. )  . Continuous Blood Gluc Receiver (FREESTYLE LIBRE 14 DAY READER) DEVI 1 each by Does not apply route once for 1 dose.  . Continuous Blood Gluc Sensor (FREESTYLE LIBRE 14 DAY SENSOR) MISC Inject 1 each into the skin every 14 (fourteen) days. Use as directed.  . dicyclomine (BENTYL) 10 MG capsule 4 (four) times daily as needed.  Marland Kitchen omega-3 acid ethyl esters (LOVAZA) 1 g capsule TAKE TWO CAPSULES BY MOUTH TWICE DAILY (Patient not taking: Reported on 12/05/2017)  . ranitidine (ZANTAC) 150 MG tablet  Take 150 mg by mouth at bedtime.  . ursodiol (ACTIGALL) 250 MG tablet TAKE 1 TABLET BY MOUTH TWICE DAILY, START TAKING THREE WEEKS AFTER HOSPITAL DISCHARGE  . [DISCONTINUED] HYDROcodone-acetaminophen (NORCO/VICODIN) 5-325 MG tablet Take 1 tablet by mouth 3 (three) times daily as needed for moderate pain.  . [DISCONTINUED] ibuprofen (ADVIL,MOTRIN) 800 MG tablet Take 800 mg by mouth every 8 (eight) hours as needed for mild pain or moderate pain.   . [DISCONTINUED] LEVEMIR FLEXTOUCH 100 UNIT/ML Pen INJECT 90 UNITS INTO THE SKIN AT BEDTIME   No facility-administered encounter medications on file as of 03/11/2018.    ALLERGIES: Allergies  Allergen Reactions  . Parsley Seed Anaphylaxis  . Codeine     Swelling   . Sulfa  Antibiotics     Rash   . Penicillins Rash   VACCINATION STATUS:  There is no immunization history on file for this patient.  Diabetes  She presents for her follow-up diabetic visit. She has type 2 diabetes mellitus. Onset time: She was diagnosed at approximate age of 32 years preceded by episodes of gestational diabetes on 2 separate pregnancies in her 67s. Her disease course has been improving (She underwent gastric bypass for weight loss since her last visit.). There are no hypoglycemic associated symptoms. Pertinent negatives for hypoglycemia include no confusion, headaches, pallor or seizures. Pertinent negatives for diabetes include no chest pain, no fatigue, no polydipsia, no polyphagia and no polyuria. There are no hypoglycemic complications. Symptoms are improving. Diabetic complications include peripheral neuropathy. (She says she was hospitalized due to pancreatitis last month.) Risk factors for coronary artery disease include dyslipidemia, diabetes mellitus, tobacco exposure, sedentary lifestyle, obesity and hypertension. Current diabetic treatments: She is on Levemir 80 units 3 times a day and NovoLog sliding scale, invokana 300 mg by mouth daily, and glipizide 5 mg twice a day. She is compliant with treatment none of the time. Her weight is decreasing steadily (She has lost a total of 60 pounds so far.). When asked about meal planning, she reported none. She has had a previous visit with a dietitian. She rarely participates in exercise. Her breakfast blood glucose range is generally 130-140 mg/dl. Her bedtime blood glucose range is generally 130-140 mg/dl. Her overall blood glucose range is 130-140 mg/dl. An ACE inhibitor/angiotensin II receptor blocker is not being taken. Eye exam is current.  Hypertension  This is a chronic problem. The current episode started more than 1 year ago. The problem is controlled. Pertinent negatives include no chest pain, headaches, palpitations or shortness of  breath. Risk factors for coronary artery disease include obesity, dyslipidemia, diabetes mellitus, sedentary lifestyle, smoking/tobacco exposure and family history.  Hyperlipidemia  This is a chronic problem. The current episode started more than 1 year ago. The problem is uncontrolled (Triglycerides 744). Exacerbating diseases include diabetes and obesity. Pertinent negatives include no chest pain, myalgias or shortness of breath. She is currently on no antihyperlipidemic treatment. Risk factors for coronary artery disease include dyslipidemia, diabetes mellitus, hypertension, obesity and a sedentary lifestyle.     Review of Systems  Constitutional: Negative for chills, fatigue, fever and unexpected weight change.  HENT: Negative for trouble swallowing and voice change.   Eyes: Negative for visual disturbance.  Respiratory: Negative for cough, shortness of breath and wheezing.   Cardiovascular: Negative for chest pain, palpitations and leg swelling.  Gastrointestinal: Negative for diarrhea, nausea and vomiting.  Endocrine: Negative for cold intolerance, heat intolerance, polydipsia, polyphagia and polyuria.  Musculoskeletal: Negative for arthralgias and myalgias.  Skin: Negative for color change, pallor, rash and wound.  Neurological: Negative for seizures and headaches.  Psychiatric/Behavioral: Negative for confusion and suicidal ideas.    Objective:    BP 105/74   Pulse 76   Ht 5\' 6"  (1.676 m)   Wt 209 lb (94.8 kg)   BMI 33.73 kg/m   Wt Readings from Last 3 Encounters:  03/11/18 209 lb (94.8 kg)  01/28/18 224 lb (101.6 kg)  12/05/17 249 lb 12.8 oz (113.3 kg)    Physical Exam  Constitutional: She is oriented to person, place, and time. She appears well-developed.  HENT:  Head: Normocephalic and atraumatic.  Eyes: EOM are normal.  Neck: Normal range of motion. Neck supple. No tracheal deviation present. No thyromegaly present.  Cardiovascular: Normal rate.  Pulmonary/Chest:  Effort normal.  Abdominal: There is no tenderness. There is no guarding.  Musculoskeletal: Normal range of motion. She exhibits no edema.  Neurological: She is alert and oriented to person, place, and time. She has normal reflexes. No cranial nerve deficit. Coordination normal.  Skin: Skin is warm and dry. No rash noted. No erythema. No pallor.  Psychiatric: She has a normal mood and affect. Judgment normal.   Recent Results (from the past 2160 hour(s))  Microalbumin, urine     Status: None   Collection Time: 03/03/18 12:00 AM  Result Value Ref Range   Microalb, Ur 8.4   VITAMIN D 25 Hydroxy (Vit-D Deficiency, Fractures)     Status: None   Collection Time: 03/03/18 12:00 AM  Result Value Ref Range   Vit D, 25-Hydroxy 52.8   Basic metabolic panel     Status: None   Collection Time: 03/03/18 12:00 AM  Result Value Ref Range   BUN 12 4 - 21   Creatinine 0.5 0.5 - 1.1  Vitamin B12     Status: None   Collection Time: 03/03/18 12:00 AM  Result Value Ref Range   Vitamin B-12 691   Hemoglobin A1c     Status: None   Collection Time: 03/03/18 12:00 AM  Result Value Ref Range   Hemoglobin A1C 6.3   TSH     Status: None   Collection Time: 03/03/18 12:00 AM  Result Value Ref Range   TSH 3.17 0.41 - 5.90    Comment: free t4 1.01    Lipid Panel     Component Value Date/Time   CHOL 208 (A) 11/28/2017   TRIG 203 (A) 11/28/2017   HDL 48 11/28/2017   LDLCALC 119 11/28/2017     Assessment & Plan:   1. Uncontrolled type 2 diabetes mellitus with complication, unspecified long term insulin use status (HCC)  - She  has currently controlled symptomatic type 2 DM since  47 years of age.  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    -She returns with improved glycemic profile and A1c of 6.3%,  overall improving from 9.5%.      -She continues to feel better, lost 60 pounds overall so far.   Her diabetes is complicated by peripheral neuropathy on Lyrica,  sedentary life and patient remains at a high risk for more acute and chronic complications of diabetes which include CAD, CVA, CKD, retinopathy, and neuropathy. These are all discussed in detail with the patient.  - I have counseled the patient on diet management  and weight loss, by adopting a carbohydrate restricted/protein rich diet.  -  Suggestion is made for her to avoid simple carbohydrates  from her diet including Cakes, Sweet Desserts / Pastries, Ice Cream, Soda (diet and regular), Sweet Tea, Candies, Chips, Cookies, Store Bought Juices, Alcohol in Excess of  1-2 drinks a day, Artificial Sweeteners, and "Sugar-free" Products. This will help patient to have stable blood glucose profile and potentially avoid unintended weight gain.   - I encouraged the patient to switch to  unprocessed or minimally processed complex starch and increased protein intake (animal or plant source), fruits, and vegetables.  - Patient is advised to stick to a routine mealtimes to eat 3 meals  a day and avoid unnecessary snacks ( to snack only to correct hypoglycemia).   - I have approached patient with the following individualized plan to manage diabetes and patient agrees:   - She has recovered from her recent Roux-en-Y gastric bypass which resulted in 60 pounds of weight loss so far. -Her insulin dose was significantly reduced after her surgery. -Based on her current glycemic profile, she would benefit from staying on Levemir 20 units nightly associated with monitoring of blood glucose daily before breakfast, and at any other time as needed.   -She is interested in the CGM device, discussed and prescribed the freestyle libre device for her.    - Patient is warned not to take insulin without proper monitoring per orders. -Patient is encouraged to call  clinic for blood glucose levels less than 70 or above 300 mg /dl. - She did not tolerate metformin. -Her triglycerides have improved to 203 from 744.  This opens up an  opportunity for her to use GLP-1 receptor agonists on her next visit if A1c remains above 7%.    - Patient specific target  A1c;  LDL, HDL, Triglycerides, and  Waist Circumference were discussed in detail.  2) BP/HTN: Her blood pressure is controlled to target.    She is not on antihypertensive medication at this time. 3) Lipids/HPL: Significantly  improved lipid panel with triglycerides 203 improving from 744.  She is advised to continue Lovaza 2 g p.o. twice daily.  She will have repeat lipid panel in 55-month on fasting.   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    CDE Consult in progress,  exercise, and detailed carbohydrates information provided.  5) vitamin D deficiency: - She is advised to continue vitamin D 5,000 units daily until she finishes her current supplies.  Her most recent vitamin D was replete at 52.8.  She also has adequate vitamin B12 at 691.  She will continue to with vitamin B12 supplements.    6) Chronic Care/Health Maintenance:  -Patient is on ACEI/ARB  medications and encouraged to continue to follow up with Ophthalmology, Podiatrist at least yearly or according to recommendations, and advised to   stay away from smoking. I have recommended yearly flu vaccine and pneumonia vaccination at least every 5 years; moderate intensity exercise for up to 150 minutes weekly; and  sleep for at least 7 hours a day.  - I advised patient to maintain close follow up with Kirstie Peri, MD for primary care needs.  - Time spent with the patient: 25 min, of which >50% was spent in reviewing her blood glucose logs , discussing her hypo- and hyper-glycemic episodes, reviewing her current  and  previous  labs and insulin doses and developing a plan to avoid hypo- and hyper-glycemia. Please refer to Patient Instructions for Blood Glucose Monitoring and Insulin/Medications Dosing Guide"  in media tab for additional information. [name] participated in the discussions, expressed understanding, and voiced agreement with the above plans.  All questions were answered to her satisfaction. she is encouraged to contact clinic should she have any questions or concerns prior to her return visit.   Follow up plan: - Return in about 6 months (around 09/11/2018) for Follow up with Pre-visit Labs.  Marquis Lunch, MD Phone: [phone]  Fax: [phone]  -  This note was partially dictated with voice recognition software. Similar sounding words can be transcribed inadequately or may not  be corrected upon review.  03/11/2018, 12:14 PM

## 2018-05-04 ENCOUNTER — Other Ambulatory Visit: Payer: Self-pay | Admitting: "Endocrinology

## 2018-06-15 ENCOUNTER — Other Ambulatory Visit: Payer: Self-pay

## 2018-06-15 MED ORDER — VITAMIN D3 125 MCG (5000 UT) PO CAPS: 5000.0000 [IU] | ORAL_CAPSULE | Freq: Every day | ORAL | 1 refills | Status: DC

## 2018-09-11 ENCOUNTER — Ambulatory Visit: Payer: Medicaid Other | Admitting: "Endocrinology

## 2018-09-18 ENCOUNTER — Other Ambulatory Visit: Payer: Self-pay | Admitting: "Endocrinology

## 2018-10-13 ENCOUNTER — Other Ambulatory Visit: Payer: Self-pay | Admitting: "Endocrinology

## 2018-10-13 ENCOUNTER — Encounter: Payer: Medicaid Other | Admitting: "Endocrinology

## 2018-10-13 ENCOUNTER — Other Ambulatory Visit: Payer: Self-pay

## 2018-10-13 DIAGNOSIS — IMO0002 Reserved for concepts with insufficient information to code with codable children: Secondary | ICD-10-CM

## 2018-10-13 DIAGNOSIS — E118 Type 2 diabetes mellitus with unspecified complications: Secondary | ICD-10-CM

## 2018-10-13 DIAGNOSIS — E1165 Type 2 diabetes mellitus with hyperglycemia: Secondary | ICD-10-CM

## 2018-10-13 DIAGNOSIS — E559 Vitamin D deficiency, unspecified: Secondary | ICD-10-CM

## 2018-10-13 DIAGNOSIS — Z794 Long term (current) use of insulin: Principal | ICD-10-CM

## 2018-10-13 DIAGNOSIS — E782 Mixed hyperlipidemia: Secondary | ICD-10-CM

## 2018-10-13 DIAGNOSIS — R5383 Other fatigue: Secondary | ICD-10-CM

## 2018-10-23 ENCOUNTER — Other Ambulatory Visit: Payer: Self-pay | Admitting: "Endocrinology

## 2019-02-05 ENCOUNTER — Other Ambulatory Visit: Payer: Self-pay | Admitting: "Endocrinology

## 2019-06-03 ENCOUNTER — Other Ambulatory Visit: Payer: Self-pay | Admitting: "Endocrinology

## 2019-07-05 ENCOUNTER — Other Ambulatory Visit: Payer: Self-pay | Admitting: "Endocrinology

## 2019-11-22 ENCOUNTER — Other Ambulatory Visit: Payer: Self-pay | Admitting: "Endocrinology

## 2021-03-08 ENCOUNTER — Other Ambulatory Visit: Payer: Self-pay

## 2021-03-08 ENCOUNTER — Emergency Department (HOSPITAL_COMMUNITY): Payer: BC Managed Care – PPO

## 2021-03-08 ENCOUNTER — Emergency Department (HOSPITAL_COMMUNITY)
Admission: EM | Admit: 2021-03-08 | Discharge: 2021-03-09 | Disposition: A | Discharge status: Home / Self Care | Payer: BC Managed Care – PPO | Attending: Emergency Medicine | Admitting: Emergency Medicine

## 2021-03-08 ENCOUNTER — Encounter (HOSPITAL_COMMUNITY): Payer: Self-pay | Admitting: *Deleted

## 2021-03-08 DIAGNOSIS — I10 Essential (primary) hypertension: Secondary | ICD-10-CM | POA: Diagnosis not present

## 2021-03-08 DIAGNOSIS — R109 Unspecified abdominal pain: Secondary | ICD-10-CM

## 2021-03-08 DIAGNOSIS — E119 Type 2 diabetes mellitus without complications: Secondary | ICD-10-CM | POA: Insufficient documentation

## 2021-03-08 DIAGNOSIS — R1013 Epigastric pain: Secondary | ICD-10-CM | POA: Diagnosis not present

## 2021-03-08 DIAGNOSIS — R1010 Upper abdominal pain, unspecified: Secondary | ICD-10-CM

## 2021-03-08 DIAGNOSIS — R112 Nausea with vomiting, unspecified: Secondary | ICD-10-CM | POA: Insufficient documentation

## 2021-03-08 DIAGNOSIS — Z794 Long term (current) use of insulin: Secondary | ICD-10-CM | POA: Diagnosis not present

## 2021-03-08 DIAGNOSIS — R1012 Left upper quadrant pain: Secondary | ICD-10-CM | POA: Diagnosis present

## 2021-03-08 HISTORY — DX: Acute pancreatitis without necrosis or infection, unspecified: K85.90

## 2021-03-08 LAB — LIPASE, BLOOD: Lipase: 33 U/L (ref 11–51)

## 2021-03-08 LAB — COMPREHENSIVE METABOLIC PANEL
ALT: 13 U/L (ref 0–44)
AST: 13 U/L — ABNORMAL LOW (ref 15–41)
Albumin: 3.5 g/dL (ref 3.5–5.0)
Alkaline Phosphatase: 63 U/L (ref 38–126)
Anion gap: 6 (ref 5–15)
BUN: 12 mg/dL (ref 6–20)
CO2: 24 mmol/L (ref 22–32)
Calcium: 9 mg/dL (ref 8.9–10.3)
Chloride: 109 mmol/L (ref 98–111)
Creatinine, Ser: 0.76 mg/dL (ref 0.44–1.00)
GFR, Estimated: >60 mL/min (ref >60)
Glucose, Bld: 126 mg/dL — ABNORMAL HIGH (ref 70–99)
Potassium: 3.4 mmol/L — ABNORMAL LOW (ref 3.5–5.1)
Sodium: 139 mmol/L (ref 135–145)
Total Bilirubin: 0.3 mg/dL (ref 0.3–1.2)
Total Protein: 6.8 g/dL (ref 6.5–8.1)

## 2021-03-08 LAB — CBC
HCT: 36.5 % (ref 36.0–46.0)
Hemoglobin: 11.1 g/dL — ABNORMAL LOW (ref 12.0–15.0)
MCH: 26 pg (ref 26.0–34.0)
MCHC: 30.4 g/dL (ref 30.0–36.0)
MCV: 85.5 fL (ref 80.0–100.0)
Platelets: 364 10*3/uL (ref 150–400)
RBC: 4.27 MIL/uL (ref 3.87–5.11)
RDW: 15.6 % — ABNORMAL HIGH (ref 11.5–15.5)
WBC: 10.2 10*3/uL (ref 4.0–10.5)
nRBC: 0 % (ref 0.0–0.2)

## 2021-03-08 LAB — HCG, SERUM, QUALITATIVE: Preg, Serum: NEGATIVE

## 2021-03-08 MED ORDER — FAMOTIDINE IN NACL 20-0.9 MG/50ML-% IV SOLN
20.0000 mg | Freq: Once | INTRAVENOUS | Status: AC
  Administered 2021-03-08: 20 mg via INTRAVENOUS
  Filled 2021-03-08: qty 50

## 2021-03-08 MED ORDER — IOHEXOL 350 MG/ML SOLN: 80.0000 mL | Freq: Once | INTRAVENOUS | Status: DC | PRN

## 2021-03-08 MED ORDER — SODIUM CHLORIDE 0.9 % IV BOLUS
1000.0000 mL | Freq: Once | INTRAVENOUS | Status: AC
  Administered 2021-03-08: 1000 mL via INTRAVENOUS

## 2021-03-08 MED ORDER — MORPHINE SULFATE (PF) 4 MG/ML IV SOLN
4.0000 mg | Freq: Once | INTRAVENOUS | Status: AC
Start: 2021-03-08 — End: 2021-03-08
  Administered 2021-03-08: 4 mg via INTRAVENOUS
  Filled 2021-03-08: qty 1

## 2021-03-08 MED ORDER — ONDANSETRON HCL 4 MG/2ML IJ SOLN
4.0000 mg | Freq: Once | INTRAMUSCULAR | Status: AC
  Administered 2021-03-08: 4 mg via INTRAVENOUS
  Filled 2021-03-08: qty 2

## 2021-03-08 NOTE — ED Provider Notes (Signed)
Care assumed from [facility].  Patient with a history of pancreatitis, diabetes, gastric bypass, ventral hernia here with abdominal pain in her epigastrium and upper abdomen with nausea and vomiting.  Labs are reassuring. CT scan pending at shift change.  Patient scheduled for incisional hernia repair at [facility] next month.  No evidence of pancreatitis.  CT scan shows stable postsurgical changes.  Stable incisional and ventral hernia without bowel.  Patient still with burning epigastric pain.  Will initiate GI cocktail.  Denies any missed doses of her Nexium.  Patient feels viscous lidocaine and Maalox made her feel worse.  However she has not had any vomiting and can tolerate p.o. fluids.  Will add Carafate to her Nexium, follow-up with gastroenterology.  Avoid alcohol, caffeine, NSAIDs, spicy foods.  Return precautions discussed   Glynn Octave, MD 03/09/21 [phone]

## 2021-03-08 NOTE — ED Provider Notes (Signed)
[facility] EMERGENCY DEPARTMENT Provider Note   CSN: [id number] Arrival date & time: 03/08/21  1704     History Chief Complaint  Patient presents with   Abdominal Pain    Kara Hopkins is a 50 y.o. female.  HPI  50 year old female history of diabetes, hyperlipidemia, hypertension, pancreatitis, history of gastric bypass, who presents the emergency department today complaining of abdominal pain.  States she has had abdominal pain for the last 3 to 4 days.  Pain is constant in nature and is located to the epigastrium and left upper quadrant.  It feels sharp pain is worse anytime she tries to eat or drink anything.  She is had some associated nausea and vomiting.  When her symptoms started initially she had some diarrhea however this is since resolved and she has not had a bowel movement since.  She is had some chills but no documented fevers and she denies any urinary symptoms.  Past Medical History:  Diagnosis Date   Diabetes mellitus without complication (HCC)    Hyperlipidemia    Hypertension    Pancreatitis     Patient Active Problem List   Diagnosis Date Noted   History of gastric bypass 01/28/2018   Morbid obesity (HCC) 09/03/2017   Uncontrolled type 2 diabetes mellitus with complication, with long-term current use of insulin (HCC) 05/27/2017   Mixed hyperlipidemia 02/19/2017   Personal history of noncompliance with medical treatment, presenting hazards to health 12/03/2016   Vitamin D deficiency 12/03/2016   Uncontrolled type 2 diabetes mellitus with complication (HCC) 06/03/2016   Essential hypertension, benign 06/03/2016   Class 3 obesity due to excess calories with serious comorbidity and body mass index (BMI) of 40.0 to 44.9 in adult 06/03/2016   Tibia fracture 09/29/2015    Past Surgical History:  Procedure Laterality Date   CESAREAN SECTION     HERNIA REPAIR     TIBIA IM NAIL INSERTION Left 09/29/2015   Procedure: OPEN REDUCTION INTERNAL FIXATION (ORIF)  LEFT TIBIA  SHAFT FRACTURE;  Surgeon: Sheral Apley, MD;  Location: [facility];  Service: Orthopedics;  Laterality: Left;     OB History   No obstetric history on file.     Family History  Problem Relation Age of Onset   Cancer Father     Social History   Tobacco Use   Smoking status: Never   Smokeless tobacco: Never  Vaping Use   Vaping Use: Never used  Substance Use Topics   Alcohol use: No   Drug use: No    Home Medications Prior to Admission medications   Medication Sig Start Date End Date Taking? Authorizing Provider  Cholecalciferol (VITAMIN D3) 125 MCG (5000 UT) CAPS TAKE 1 CAPSULE BY MOUTH EVERY DAY 07/06/19   Nida, Denman George, MD  Continuous Blood Gluc Sensor (FREESTYLE LIBRE 14 DAY SENSOR) MISC Inject 1 each into the skin every 14 (fourteen) days. Use as directed. 03/11/18   Roma Kayser, MD  dicyclomine (BENTYL) 10 MG capsule 4 (four) times daily as needed. 02/17/18   [provider]  esomeprazole (NEXIUM) 40 MG capsule Take 40 mg by mouth daily at 12 noon.    [provider]  Insulin Detemir (LEVEMIR FLEXTOUCH) 100 UNIT/ML Pen Inject 20 Units into the skin at bedtime. 03/11/18   Roma Kayser, MD  Insulin Detemir (LEVEMIR FLEXTOUCH) 100 UNIT/ML Pen Inject 20 Units into the skin at bedtime. 05/05/18   Roma Kayser, MD  omega-3 acid ethyl esters (  LOVAZA) 1 g capsule TAKE TWO CAPSULES BY MOUTH TWICE DAILY Patient not taking: Reported on 12/05/2017 11/24/17   Roma Kayser, MD  oxyCODONE (OXY IR/ROXICODONE) 5 MG immediate release tablet every 8 (eight) hours as needed.    [provider]  pregabalin (LYRICA) 300 MG capsule Take 300 mg by mouth 2 (two) times daily.    [provider]  promethazine (PHENERGAN) 25 MG tablet Take 25 mg by mouth.    [provider]  ranitidine (ZANTAC) 150 MG tablet Take 150 mg by mouth at bedtime. 02/11/18   [provider]  ursodiol  (ACTIGALL) 250 MG tablet TAKE 1 TABLET BY MOUTH TWICE DAILY, START TAKING THREE WEEKS AFTER HOSPITAL DISCHARGE 02/04/18   [provider]    Allergies    Parsley seed, Codeine, Sulfa antibiotics, and Penicillins  Review of Systems   Review of Systems  Constitutional:  Positive for chills. Negative for fever.  HENT:  Negative for ear pain and sore throat.   Eyes:  Negative for visual disturbance.  Respiratory:  Negative for cough and shortness of breath.   Cardiovascular:  Negative for chest pain.  Gastrointestinal:  Positive for abdominal pain, nausea and vomiting. Negative for constipation and diarrhea.  Genitourinary:  Negative for dysuria and hematuria.  Musculoskeletal:  Negative for back pain.  Skin:  Negative for color change and rash.  Neurological:  Negative for seizures and syncope.  All other systems reviewed and are negative.  Physical Exam Updated Vital Signs BP 109/77 (BP Location: Right Arm)   Pulse 83   Temp 97.7 F (36.5 C) (Oral)   Resp 20   Ht 5\' 6"  (1.676 m)   Wt 69.9 kg   LMP 02/28/2021   SpO2 100%   BMI 24.86 kg/m   Physical Exam Vitals and nursing note reviewed.  Constitutional:      General: She is not in acute distress.    Appearance: She is well-developed.  HENT:     Head: Normocephalic and atraumatic.  Eyes:     Conjunctiva/sclera: Conjunctivae normal.  Cardiovascular:     Rate and Rhythm: Normal rate and regular rhythm.     Heart sounds: Normal heart sounds. No murmur heard. Pulmonary:     Effort: Pulmonary effort is normal. No respiratory distress.     Breath sounds: Normal breath sounds. No wheezing, rhonchi or rales.  Abdominal:     General: Bowel sounds are normal.     Palpations: Abdomen is soft.     Tenderness: There is abdominal tenderness in the epigastric area and left upper quadrant. There is guarding. There is no rebound.  Musculoskeletal:     Cervical back: Neck supple.  Skin:    General: Skin is warm and dry.   Neurological:     Mental Status: She is alert.    ED Results / Procedures / Treatments   Labs (all labs ordered are listed, but only abnormal results are displayed) Labs Reviewed  COMPREHENSIVE METABOLIC PANEL - Abnormal; Notable for the following components:      Result Value   Potassium 3.4 (*)    Glucose, Bld 126 (*)    AST 13 (*)    All other components within normal limits  CBC - Abnormal; Notable for the following components:   Hemoglobin 11.1 (*)    RDW 15.6 (*)    All other components within normal limits  LIPASE, BLOOD  URINALYSIS, ROUTINE W REFLEX MICROSCOPIC  PREGNANCY, URINE  HCG, SERUM, QUALITATIVE  EKG EKG Interpretation  Date/Time:  Thursday March 08 2021 17:27:52 EDT Ventricular Rate:  87 PR Interval:  162 QRS Duration: 74 QT Interval:  360 QTC Calculation: 433 R Axis:   57 Text Interpretation: Normal sinus rhythm Low voltage QRS Borderline ECG Since last tracing Rate slower QRS amplitude is decreased Otherwise no significant change Confirmed by Susy Frizzle [phone]) on 03/08/2021 5:48:28 PM  Radiology No results found.  Procedures Procedures   Medications Ordered in ED Medications  morphine 4 MG/ML injection 4 mg (has no administration in time range)  ondansetron (ZOFRAN) injection 4 mg (has no administration in time range)  sodium chloride 0.9 % bolus 1,000 mL (has no administration in time range)  famotidine (PEPCID) IVPB 20 mg premix (has no administration in time range)    ED Course  I have reviewed the triage vital signs and the nursing notes.  Pertinent labs & imaging results that were available during my care of the patient were reviewed by me and considered in my medical decision making (see chart for details).    MDM Rules/Calculators/A&P                          50 year old female presenting the emergency department today for evaluation of epigastric and left upper quadrant pain that started a few days ago and is associated  with nausea and vomiting  Reviewed/interpreted labs CBC is without leukocytosis or anemia CMP shows mild hypokalemia but otherwise no abnormal renal function or abnormal LFTs. Lipase is negative  At shift change, care transitioned to Dr. Manus Gunning. Plan is to f/u on pending CT abd/pelvis and po challenge patient.   Final Clinical Impression(s) / ED Diagnoses Final diagnoses:  Abdominal pain, unspecified abdominal location    Rx / DC Orders ED Discharge Orders     None        Rayne Du 03/08/21 2252    Pollyann Savoy, MD 03/08/21 2332

## 2021-03-08 NOTE — ED Triage Notes (Signed)
Pt with abd pain for about 3-4 days.  Hx of pancreatitis.  +N/v/D

## 2021-03-08 NOTE — ED Notes (Signed)
Patient transported to CT 

## 2021-03-09 MED ORDER — ONDANSETRON 4 MG PO TBDP: 4.0000 mg | ORAL_TABLET | Freq: Three times a day (TID) | ORAL | 0 refills | Status: AC | PRN

## 2021-03-09 MED ORDER — ESOMEPRAZOLE MAGNESIUM 40 MG PO CPDR: 40.0000 mg | DELAYED_RELEASE_CAPSULE | Freq: Every day | ORAL | 0 refills | Status: AC

## 2021-03-09 MED ORDER — ALUM & MAG HYDROXIDE-SIMETH 200-200-20 MG/5ML PO SUSP
30.0000 mL | Freq: Once | ORAL | Status: AC
  Administered 2021-03-09: 30 mL via ORAL
  Filled 2021-03-09: qty 30

## 2021-03-09 MED ORDER — PANTOPRAZOLE SODIUM 40 MG IV SOLR
40.0000 mg | Freq: Once | INTRAVENOUS | Status: AC
  Administered 2021-03-09: 40 mg via INTRAVENOUS
  Filled 2021-03-09: qty 40

## 2021-03-09 MED ORDER — SUCRALFATE 1 G PO TABS: 1.0000 g | ORAL_TABLET | Freq: Three times a day (TID) | ORAL | 0 refills | Status: AC

## 2021-03-09 MED ORDER — LIDOCAINE VISCOUS HCL 2 % MT SOLN
15.0000 mL | Freq: Once | OROMUCOSAL | Status: AC
  Administered 2021-03-09: 15 mL via ORAL
  Filled 2021-03-09: qty 15

## 2021-03-09 NOTE — ED Notes (Signed)
Pt unable to finish the maalox -consumed 1/2 then stopped and said it was awful and going to make her vomit- Dr Manus Gunning made aware. Pt has been taking sips of water for PO challenge.

## 2021-03-09 NOTE — Discharge Instructions (Signed)
Your testing shows no evidence of pancreatitis.  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  We suspect your pain may be coming from your stomach or esophagus.  Continue your Nexium.  Avoid alcohol, caffeine, NSAID medications, spicy foods. Follow-up with the gastroenterologist as well as her primary doctor.  Return to the ED for worsening pain, fever, vomiting, chest pain, shortness of breath, any other concerns.

## 2021-05-16 ENCOUNTER — Other Ambulatory Visit: Payer: Self-pay | Admitting: Internal Medicine

## 2021-05-16 DIAGNOSIS — Z139 Encounter for screening, unspecified: Secondary | ICD-10-CM

## 2021-05-22 ENCOUNTER — Other Ambulatory Visit: Payer: Self-pay

## 2021-05-22 ENCOUNTER — Ambulatory Visit
Admission: RE | Admit: 2021-05-22 | Discharge: 2021-05-22 | Disposition: A | Discharge status: Home / Self Care | Payer: BC Managed Care – PPO | Source: Ambulatory Visit | Attending: Internal Medicine | Admitting: Internal Medicine

## 2021-05-22 DIAGNOSIS — Z139 Encounter for screening, unspecified: Secondary | ICD-10-CM

## 2022-04-17 ENCOUNTER — Emergency Department (HOSPITAL_COMMUNITY): Payer: Medicaid Other

## 2022-04-17 ENCOUNTER — Other Ambulatory Visit: Payer: Self-pay

## 2022-04-17 ENCOUNTER — Emergency Department (HOSPITAL_COMMUNITY)
Admission: EM | Admit: 2022-04-17 | Discharge: 2022-04-17 | Disposition: A | Discharge status: Home / Self Care | Payer: Medicaid Other | Attending: Emergency Medicine | Admitting: Emergency Medicine

## 2022-04-17 ENCOUNTER — Encounter (HOSPITAL_COMMUNITY): Payer: Self-pay | Admitting: Emergency Medicine

## 2022-04-17 DIAGNOSIS — S40011A Contusion of right shoulder, initial encounter: Secondary | ICD-10-CM | POA: Diagnosis not present

## 2022-04-17 DIAGNOSIS — W010XXA Fall on same level from slipping, tripping and stumbling without subsequent striking against object, initial encounter: Secondary | ICD-10-CM | POA: Diagnosis not present

## 2022-04-17 DIAGNOSIS — Z794 Long term (current) use of insulin: Secondary | ICD-10-CM | POA: Diagnosis not present

## 2022-04-17 DIAGNOSIS — S8000XA Contusion of unspecified knee, initial encounter: Secondary | ICD-10-CM

## 2022-04-17 DIAGNOSIS — Z79899 Other long term (current) drug therapy: Secondary | ICD-10-CM | POA: Diagnosis not present

## 2022-04-17 DIAGNOSIS — S8992XA Unspecified injury of left lower leg, initial encounter: Secondary | ICD-10-CM | POA: Diagnosis present

## 2022-04-17 DIAGNOSIS — Z23 Encounter for immunization: Secondary | ICD-10-CM | POA: Insufficient documentation

## 2022-04-17 DIAGNOSIS — S8002XA Contusion of left knee, initial encounter: Secondary | ICD-10-CM | POA: Insufficient documentation

## 2022-04-17 MED ORDER — TETANUS-DIPHTH-ACELL PERTUSSIS 5-2.5-18.5 LF-MCG/0.5 IM SUSY
0.5000 mL | PREFILLED_SYRINGE | Freq: Once | INTRAMUSCULAR | Status: AC
  Administered 2022-04-17: 0.5 mL via INTRAMUSCULAR
  Filled 2022-04-17: qty 0.5

## 2022-04-17 MED ORDER — NAPROXEN 500 MG PO TABS: 500.0000 mg | ORAL_TABLET | Freq: Two times a day (BID) | ORAL | 0 refills | Status: AC

## 2022-04-17 MED ORDER — HYDROCODONE-ACETAMINOPHEN 5-325 MG PO TABS: 1.0000 | ORAL_TABLET | Freq: Four times a day (QID) | ORAL | 0 refills | Status: AC | PRN

## 2022-04-17 MED ORDER — HYDROCODONE-ACETAMINOPHEN 5-325 MG PO TABS
1.0000 | ORAL_TABLET | Freq: Once | ORAL | Status: AC
  Administered 2022-04-17: 1 via ORAL
  Filled 2022-04-17: qty 1

## 2022-04-17 NOTE — Discharge Instructions (Signed)
I recommend ice is much as comfortable for the next 2 days to your areas of pain to help reduce inflammation and pain.  Take the medications prescribed, do not drive within 4 hours of taking hydrocodone as this medication will make you drowsy.  Plan to follow-up with your orthopedist in [place] if your symptoms are not improving over the next 10 days.  Your x-rays are negative for any acute injuries.

## 2022-04-17 NOTE — ED Triage Notes (Signed)
Pt to ER states she tripped and fell at work while walking falling on both knees.  Pt c/o bilateral knee pain and right shoulder pain.  Pt denies hitting head, denies LOC.

## 2022-04-17 NOTE — ED Notes (Signed)
Patient transported to X-ray 

## 2022-04-17 NOTE — ED Provider Notes (Signed)
[facility] EMERGENCY DEPARTMENT Provider Note   CSN: [id number] Arrival date & time: 04/17/22  1017     History  Chief Complaint  Patient presents with   Kara Hopkins    Kara Hopkins is a 51 y.o. female presenting for evaluation of a trip and fall which occurred at work just prior to arrival.  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  She landed directly on her knees, right predominantly but endorses pain in both of her knees, also fell on her right side causing pain to her right shoulder.  She denies hitting her head and denies head or neck pain, LOC, headache, nausea or vomiting.  She sustained an abrasion on her right knee which was cleaned and dressed prior to arrival including triple antibiotic ointment.  She is unsure of her last tetanus dosing.    The history is provided by the patient.       Home Medications Prior to Admission medications   Medication Sig Start Date End Date Taking? Authorizing Provider  HYDROcodone-acetaminophen (NORCO/VICODIN) 5-325 MG tablet Take 1 tablet by mouth every 6 (six) hours as needed. 04/17/22  Yes Arnold Kester, Almyra Free, PA-C  naproxen (NAPROSYN) 500 MG tablet Take 1 tablet (500 mg total) by mouth 2 (two) times daily. 04/17/22  Yes Javona Bergevin, Almyra Free, PA-C  Cholecalciferol (VITAMIN D3) 125 MCG (5000 UT) CAPS TAKE 1 CAPSULE BY MOUTH EVERY DAY 07/06/19   Cassandria Anger, MD  Continuous Blood Gluc Sensor (FREESTYLE LIBRE 14 DAY SENSOR) MISC Inject 1 each into the skin every 14 (fourteen) days. Use as directed. 03/11/18   Cassandria Anger, MD  dicyclomine (BENTYL) 10 MG capsule 4 (four) times daily as needed. 02/17/18   [provider]  esomeprazole (NEXIUM) 40 MG capsule Take 1 capsule (40 mg total) by mouth daily at 12 noon. 03/09/21   Rancour, Annie Main, MD  Insulin Detemir (LEVEMIR FLEXTOUCH) 100 UNIT/ML Pen Inject 20 Units into the skin at bedtime. 03/11/18   Cassandria Anger, MD   Insulin Detemir (LEVEMIR FLEXTOUCH) 100 UNIT/ML Pen Inject 20 Units into the skin at bedtime. 05/05/18   Cassandria Anger, MD  omega-3 acid ethyl esters (LOVAZA) 1 g capsule TAKE TWO CAPSULES BY MOUTH TWICE DAILY Patient not taking: Reported on 12/05/2017 11/24/17   Cassandria Anger, MD  ondansetron (ZOFRAN ODT) 4 MG disintegrating tablet Take 1 tablet (4 mg total) by mouth every 8 (eight) hours as needed for nausea or vomiting. 03/09/21   Rancour, Annie Main, MD  oxyCODONE (OXY IR/ROXICODONE) 5 MG immediate release tablet every 8 (eight) hours as needed.    [provider]  pregabalin (LYRICA) 300 MG capsule Take 300 mg by mouth 2 (two) times daily.    [provider]  promethazine (PHENERGAN) 25 MG tablet Take 25 mg by mouth.    [provider]  ranitidine (ZANTAC) 150 MG tablet Take 150 mg by mouth at bedtime. 02/11/18   [provider]  sucralfate (CARAFATE) 1 g tablet Take 1 tablet (1 g total) by mouth 4 (four) times daily -  with meals and at bedtime. 03/09/21   Rancour, Annie Main, MD  ursodiol (ACTIGALL) 250 MG tablet TAKE 1 TABLET BY MOUTH TWICE DAILY, START TAKING [place] DISCHARGE 02/04/18   [provider]      Allergies    Iohexol, Parsley seed, Codeine, Sulfa antibiotics, and Penicillins    Review of Systems  Review of Systems  Constitutional:  Negative for fever.  HENT: Negative.    Eyes: Negative.   Respiratory: Negative.    Cardiovascular: Negative.   Gastrointestinal: Negative.   Musculoskeletal:  Positive for arthralgias. Negative for joint swelling and myalgias.  Skin:  Positive for wound.  Neurological:  Negative for weakness, numbness and headaches.    Physical Exam Updated Vital Signs BP 113/73   Pulse 77   Temp 98.2 F (36.8 C) (Oral)   Resp 18   Ht 5\' 6"  (1.676 m)   Wt 77.1 kg   LMP 04/04/2022 (Approximate)   SpO2 100%   BMI 27.44 kg/m  Physical Exam Constitutional:      Appearance:  She is well-developed.  HENT:     Head: Atraumatic.  Cardiovascular:     Comments: Pulses equal bilaterally Musculoskeletal:        General: Tenderness present.     Right shoulder: Bony tenderness present.     Cervical back: Normal range of motion.     Right knee: Bony tenderness present. No swelling or deformity. Decreased range of motion. Normal pulse.     Left knee: Bony tenderness present. No swelling or deformity. Normal pulse.     Comments: Tender to palpation anterior right shoulder and distal clavicle with no palpable deformity.  No knee joint instability appreciated.  Patient displays flexion and extension of bilateral ankles without pain.  Skin:    General: Skin is warm and dry.     Comments: 3 cm abrasion right anterior knee.  Neurological:     Mental Status: She is alert.     Sensory: No sensory deficit.     Motor: No weakness.     Deep Tendon Reflexes: Reflexes normal.     ED Results / Procedures / Treatments   Labs (all labs ordered are listed, but only abnormal results are displayed) Labs Reviewed - No data to display  EKG None  Radiology DG Shoulder Right  Result Date: 04/17/2022 CLINICAL DATA:  Fall, shoulder pain EXAM: RIGHT SHOULDER - 2+ VIEW COMPARISON:  None Available. FINDINGS: There is no evidence of fracture or dislocation. Small calcification projecting inferior to the glenoid rim, may represent a loose body within the axillary pouch. Homogeneously mineralized ovoid density adjacent to the greater tuberosity compatible with hydroxyapatite deposition/calcific rotator cuff tendinopathy. Soft tissues are unremarkable. IMPRESSION: 1. No acute osseous abnormality of the right shoulder. 2. Small calcification projecting inferior to the glenoid rim, may represent a loose body within the axillary pouch. 3. Calcific rotator cuff tendinopathy. Electronically Signed   By: Davina Poke D.O.   On: 04/17/2022 12:21   DG Knee Complete 4 Views Right  Result Date:  04/17/2022 CLINICAL DATA:  Fall, right knee pain EXAM: RIGHT KNEE - COMPLETE 4+ VIEW COMPARISON:  None Available. FINDINGS: No evidence of fracture, dislocation, or joint effusion. No evidence of arthropathy or other focal bone abnormality. Soft tissues are unremarkable. IMPRESSION: Negative. Electronically Signed   By: Davina Poke D.O.   On: 04/17/2022 12:19   DG Knee Complete 4 Views Left  Result Date: 04/17/2022 CLINICAL DATA:  Fall, knee pain EXAM: LEFT KNEE - COMPLETE 4+ VIEW COMPARISON:  None Available. FINDINGS: No evidence of fracture, dislocation, or joint effusion. Partially imaged left tibial ORIF hardware without evidence of complication. No evidence of arthropathy or other focal bone abnormality. Soft tissues are unremarkable. IMPRESSION: Negative. Electronically Signed   By: Davina Poke D.O.   On: 04/17/2022 12:17    Procedures  Procedures    Medications Ordered in ED Medications  HYDROcodone-acetaminophen (NORCO/VICODIN) 5-325 MG per tablet 1 tablet (1 tablet Oral Given 04/17/22 1209)  Tdap (BOOSTRIX) injection 0.5 mL (0.5 mLs Intramuscular Given 04/17/22 1209)    ED Course/ Medical Decision Making/ A&P                           Medical Decision Making Patient presenting with bilateral knee pain and right shoulder pain after a trip and fall occurring at work this morning.  No head injury.  Imaging is unremarkable, she does have moderate pain, particularly of the right knee and has a moderate abrasion as well at the patella.  Her tetanus was updated.  Wound is clean and was dressed prior to arrival.  Discussed right shoulder findings of calcific tendinitis, she denies prior injury to this shoulder but does have significant pain with range of motion of the joint.  She is neurovascularly intact.  She is placed in a sling for comfort.  She has seen Dr. Percell Miller in the past for other orthopedic concerns, she was advised to follow-up with him for further management and treatment of  her symptoms.  We discussed acute home care including ice, activity as tolerated.  She is prescribed naproxen and hydrocodone for pain relief.  Amount and/or Complexity of Data Reviewed Radiology: ordered.    Details: Reviewed, bilateral knee films are negative for acute bony injury.  Risk Prescription drug management.           Final Clinical Impression(s) / ED Diagnoses Final diagnoses:  Contusion of knee, unspecified laterality, initial encounter  Contusion of right shoulder, initial encounter    Rx / DC Orders ED Discharge Orders          Ordered    naproxen (NAPROSYN) 500 MG tablet  2 times daily        04/17/22 1304    HYDROcodone-acetaminophen (NORCO/VICODIN) 5-325 MG tablet  Every 6 hours PRN        04/17/22 1304              Evalee Jefferson, PA-C 04/17/22 1854    Dorie Rank, MD 04/18/22 [phone]

## 2022-08-13 ENCOUNTER — Emergency Department (HOSPITAL_COMMUNITY)
Admission: EM | Admit: 2022-08-13 | Discharge: 2022-08-13 | Disposition: A | Discharge status: Home / Self Care | Payer: Medicaid Other | Attending: Emergency Medicine | Admitting: Emergency Medicine

## 2022-08-13 ENCOUNTER — Other Ambulatory Visit: Payer: Self-pay

## 2022-08-13 ENCOUNTER — Emergency Department (HOSPITAL_COMMUNITY): Payer: Medicaid Other

## 2022-08-13 ENCOUNTER — Encounter (HOSPITAL_COMMUNITY): Payer: Self-pay | Admitting: Emergency Medicine

## 2022-08-13 DIAGNOSIS — Z794 Long term (current) use of insulin: Secondary | ICD-10-CM | POA: Insufficient documentation

## 2022-08-13 DIAGNOSIS — M25532 Pain in left wrist: Secondary | ICD-10-CM | POA: Diagnosis not present

## 2022-08-13 DIAGNOSIS — Y92813 Airplane as the place of occurrence of the external cause: Secondary | ICD-10-CM | POA: Diagnosis not present

## 2022-08-13 DIAGNOSIS — E119 Type 2 diabetes mellitus without complications: Secondary | ICD-10-CM | POA: Diagnosis not present

## 2022-08-13 DIAGNOSIS — W108XXA Fall (on) (from) other stairs and steps, initial encounter: Secondary | ICD-10-CM | POA: Diagnosis not present

## 2022-08-13 DIAGNOSIS — G8911 Acute pain due to trauma: Secondary | ICD-10-CM | POA: Diagnosis not present

## 2022-08-13 DIAGNOSIS — Y990 Civilian activity done for income or pay: Secondary | ICD-10-CM | POA: Diagnosis not present

## 2022-08-13 MED ORDER — HYDROCODONE-ACETAMINOPHEN 5-325 MG PO TABS
1.0000 | ORAL_TABLET | Freq: Once | ORAL | Status: AC
  Administered 2022-08-13: 1 via ORAL
  Filled 2022-08-13: qty 1

## 2022-08-13 NOTE — Discharge Instructions (Signed)
Your x-rays today do not show an obvious fracture or dislocation, although there is a subtle irregularity at the base of your radius, this might reflect an occult fracture, although this could also represent a chronic finding.  You are being placed in a wrist splint to wear at all times.  Elevate and continue using ice to help with pain and swelling.  You will need follow-up care with orthopedics to further evaluate the site, if your pain persists an MRI may be needed for further evaluation.  Continue using your home oxycodone for pain relief.  You may add Tylenol if needed for additional pain relief since you cannot take NSAIDs.

## 2022-08-13 NOTE — ED Provider Notes (Signed)
[facility] Provider Note   CSN: [id number] Arrival date & time: 08/13/22  0845     History  Chief Complaint  Patient presents with   Wrist Pain    Kara Hopkins is a 52 y.o. female , right handed, presenting with left proximal thumb through wrist pain after tripping going up a flight of steps, describing a foosh mechanism, occurring at work just prior to arrival.  She has pain with movement which localizes to the mcp joint of her thumb.  She denies numbness distal to the injury site,  no pain in the forearm or elbow.  She denies any other injury.  She applied ice prior to arrival.   PMH sig for DM and gastric bypass surgery.  She cannot take nsaids.   The history is provided by the patient.       Home Medications Prior to Admission medications   Medication Sig Start Date End Date Taking? Authorizing Provider  Cholecalciferol (VITAMIN D3) 125 MCG (5000 UT) CAPS TAKE 1 CAPSULE BY MOUTH EVERY DAY 07/06/19   Nida, Marella Chimes, MD  Continuous Blood Gluc Sensor (FREESTYLE LIBRE 14 DAY SENSOR) MISC Inject 1 each into the skin every 14 (fourteen) days. Use as directed. 03/11/18   Cassandria Anger, MD  dicyclomine (BENTYL) 10 MG capsule 4 (four) times daily as needed. 02/17/18   [provider]  esomeprazole (NEXIUM) 40 MG capsule Take 1 capsule (40 mg total) by mouth daily at 12 noon. 03/09/21   Rancour, Annie Main, MD  HYDROcodone-acetaminophen (NORCO/VICODIN) 5-325 MG tablet Take 1 tablet by mouth every 6 (six) hours as needed. 04/17/22   Teia Freitas, Almyra Free, PA-C  Insulin Detemir (LEVEMIR FLEXTOUCH) 100 UNIT/ML Pen Inject 20 Units into the skin at bedtime. 03/11/18   Cassandria Anger, MD  Insulin Detemir (LEVEMIR FLEXTOUCH) 100 UNIT/ML Pen Inject 20 Units into the skin at bedtime. 05/05/18   Cassandria Anger, MD  naproxen (NAPROSYN) 500 MG tablet Take 1 tablet (500 mg total) by mouth 2 (two) times daily. 04/17/22   Evalee Jefferson,  PA-C  omega-3 acid ethyl esters (LOVAZA) 1 g capsule TAKE TWO CAPSULES BY MOUTH TWICE DAILY Patient not taking: Reported on 12/05/2017 11/24/17   Cassandria Anger, MD  ondansetron (ZOFRAN ODT) 4 MG disintegrating tablet Take 1 tablet (4 mg total) by mouth every 8 (eight) hours as needed for nausea or vomiting. 03/09/21   Rancour, Annie Main, MD  oxyCODONE (OXY IR/ROXICODONE) 5 MG immediate release tablet every 8 (eight) hours as needed.    [provider]  pregabalin (LYRICA) 300 MG capsule Take 300 mg by mouth 2 (two) times daily.    [provider]  promethazine (PHENERGAN) 25 MG tablet Take 25 mg by mouth.    [provider]  ranitidine (ZANTAC) 150 MG tablet Take 150 mg by mouth at bedtime. 02/11/18   [provider]  sucralfate (CARAFATE) 1 g tablet Take 1 tablet (1 g total) by mouth 4 (four) times daily -  with meals and at bedtime. 03/09/21   Rancour, Annie Main, MD  ursodiol (ACTIGALL) 250 MG tablet TAKE 1 TABLET BY MOUTH TWICE DAILY, START TAKING [place] DISCHARGE 02/04/18   [provider]      Allergies    Iohexol, Parsley seed, Codeine, Sulfa antibiotics, and Penicillins    Review of Systems   Review of Systems  Constitutional:  Negative for fever.  Musculoskeletal:  Positive for arthralgias and joint swelling. Negative  for myalgias.  Neurological:  Negative for weakness and numbness.  All other systems reviewed and are negative.   Physical Exam Updated Vital Signs BP 119/78 (BP Location: Left Arm)   Pulse 78   Temp 98.3 F (36.8 C) (Oral)   Resp 19   Ht 5\' 6"  (1.676 m)   Wt 74.8 kg   SpO2 100%   BMI 26.63 kg/m  Physical Exam Constitutional:      Appearance: She is well-developed.  HENT:     Head: Atraumatic.  Cardiovascular:     Comments: Pulses equal bilaterally Musculoskeletal:        General: Tenderness present.     Left hand: Bony tenderness present. Normal sensation. There is no disruption of  two-point discrimination. Normal capillary refill. Normal pulse.     Cervical back: Normal range of motion.     Comments: Ttp left thumb mcp joint and along her thumb metacarpal bone.  No deformity, sts present.  Distal sensation intact with less than 2 sec cap refill fingertip.   Skin:    General: Skin is warm and dry.  Neurological:     Mental Status: She is alert.     Sensory: No sensory deficit.     Motor: No weakness.     Deep Tendon Reflexes: Reflexes normal.     ED Results / Procedures / Treatments   Labs (all labs ordered are listed, but only abnormal results are displayed) Labs Reviewed - No data to display  EKG None  Radiology DG Hand Complete Left  Result Date: 08/13/2022 CLINICAL DATA:  Wrist pain radiating into the thumb after falling. EXAM: LEFT HAND - COMPLETE 3+ VIEW; LEFT WRIST - COMPLETE 3+ VIEW COMPARISON:  None Available. FINDINGS: The mineralization and alignment are normal. On the AP view of the wrist, there is questionable irregularity of the radial styloid which is not clearly seen on the additional views. There is a well corticated ossific fragment adjacent to the ulnar styloid which may reflect an accessory ossicle or sequela of remote trauma. No other evidence of acute fracture or dislocation. There are mild degenerative changes at the 1st carpometacarpal articulation. No significant soft tissue abnormalities are identified. IMPRESSION: 1. No definite acute osseous findings within the left wrist or hand. 2. Minimal irregularity of the radial styloid is demonstrated on only one view and is of doubtful significance. Correlate with point tenderness. Electronically Signed   By: Richardean Sale M.D.   On: 08/13/2022 10:07   DG Wrist Complete Left  Result Date: 08/13/2022 CLINICAL DATA:  Wrist pain radiating into the thumb after falling. EXAM: LEFT HAND - COMPLETE 3+ VIEW; LEFT WRIST - COMPLETE 3+ VIEW COMPARISON:  None Available. FINDINGS: The mineralization and  alignment are normal. On the AP view of the wrist, there is questionable irregularity of the radial styloid which is not clearly seen on the additional views. There is a well corticated ossific fragment adjacent to the ulnar styloid which may reflect an accessory ossicle or sequela of remote trauma. No other evidence of acute fracture or dislocation. There are mild degenerative changes at the 1st carpometacarpal articulation. No significant soft tissue abnormalities are identified. IMPRESSION: 1. No definite acute osseous findings within the left wrist or hand. 2. Minimal irregularity of the radial styloid is demonstrated on only one view and is of doubtful significance. Correlate with point tenderness. Electronically Signed   By: Richardean Sale M.D.   On: 08/13/2022 10:07    Procedures Procedures    Medications  Ordered in ED Medications  HYDROcodone-acetaminophen (NORCO/VICODIN) 5-325 MG per tablet 1 tablet (1 tablet Oral Given 08/13/22 1537)    ED Course/ Medical Decision Making/ A&P                             Medical Decision Making Patient presenting with a [place] type injury to the left upper extremity, pain about the left thumb and extending into the wrist.  There is a questionable small abnormality without obvious fracture at her distal radius.  This was discussed with the patient.  She is placed in a Velcro wrist splint and will plan close follow-up with orthopedics.  Discussed other home treatment.  She is prescribed oxycodone 120 tablets/month, she was advised she can supplement this with Tylenol if needed for additional pain relief.  Amount and/or Complexity of Data Reviewed Radiology: ordered.    Details: Reviewed, agree with interpretation.  Risk Prescription drug management.           Final Clinical Impression(s) / ED Diagnoses Final diagnoses:  Acute pain of left wrist    Rx / DC Orders ED Discharge Orders     None         Landis Martins 08/13/22  1159    Godfrey Pick, MD 08/17/22 [phone]

## 2022-08-13 NOTE — ED Triage Notes (Signed)
Patient states she slipped off of the 4th step walking up and fell back landing on her left wrist. Pain radiates from her wrist into her thumb. Patient did not hit her head, denies LOC. Pulses present.

## 2022-08-15 ENCOUNTER — Telehealth: Payer: Self-pay

## 2022-08-15 NOTE — Telephone Encounter (Signed)
Patient called to schedule an appointment, but she got hurt while at work and she turned it in to them.  I explained how Worker's Comp works with our office protocol. She stated she understood and will get with her job personnel and see what the next step is.
